# Patient Record
Sex: Female | Born: 1945
Health system: Southern US, Community
[De-identification: ages and names within clinical notes are randomized; demographics above are authoritative.]

## PROBLEM LIST (undated history)

## (undated) DIAGNOSIS — M1990 Unspecified osteoarthritis, unspecified site: Secondary | ICD-10-CM

## (undated) DIAGNOSIS — F32A Depression, unspecified: Secondary | ICD-10-CM

## (undated) DIAGNOSIS — J449 Chronic obstructive pulmonary disease, unspecified: Secondary | ICD-10-CM

## (undated) DIAGNOSIS — R0602 Shortness of breath: Secondary | ICD-10-CM

## (undated) DIAGNOSIS — I639 Cerebral infarction, unspecified: Secondary | ICD-10-CM

## (undated) DIAGNOSIS — I10 Essential (primary) hypertension: Secondary | ICD-10-CM

## (undated) DIAGNOSIS — F419 Anxiety disorder, unspecified: Secondary | ICD-10-CM

## (undated) DIAGNOSIS — F329 Major depressive disorder, single episode, unspecified: Secondary | ICD-10-CM

## (undated) DIAGNOSIS — E039 Hypothyroidism, unspecified: Secondary | ICD-10-CM

## (undated) DIAGNOSIS — R51 Headache: Secondary | ICD-10-CM

## (undated) DIAGNOSIS — J189 Pneumonia, unspecified organism: Secondary | ICD-10-CM

## (undated) DIAGNOSIS — K219 Gastro-esophageal reflux disease without esophagitis: Secondary | ICD-10-CM

## (undated) HISTORY — PX: TOTAL HIP ARTHROPLASTY: SHX124

---

## 2006-03-11 ENCOUNTER — Emergency Department (HOSPITAL_COMMUNITY): Admission: EM | Admit: 2006-03-11 | Discharge: 2006-03-11 | Payer: Self-pay | Admitting: Emergency Medicine

## 2007-03-28 ENCOUNTER — Encounter: Admission: RE | Admit: 2007-03-28 | Discharge: 2007-03-28 | Payer: Self-pay | Admitting: Internal Medicine

## 2008-06-08 ENCOUNTER — Emergency Department (HOSPITAL_COMMUNITY): Admission: EM | Admit: 2008-06-08 | Discharge: 2008-06-08 | Payer: Self-pay | Admitting: Emergency Medicine

## 2008-12-02 ENCOUNTER — Encounter: Admission: RE | Admit: 2008-12-02 | Discharge: 2008-12-02 | Payer: Self-pay | Admitting: Internal Medicine

## 2009-04-08 ENCOUNTER — Encounter: Admission: RE | Admit: 2009-04-08 | Discharge: 2009-04-08 | Payer: Self-pay | Admitting: Internal Medicine

## 2009-05-01 ENCOUNTER — Inpatient Hospital Stay (HOSPITAL_COMMUNITY): Admission: RE | Admit: 2009-05-01 | Discharge: 2009-05-06 | Payer: Self-pay | Admitting: Orthopedic Surgery

## 2009-05-04 ENCOUNTER — Encounter (INDEPENDENT_AMBULATORY_CARE_PROVIDER_SITE_OTHER): Payer: Self-pay | Admitting: Orthopedic Surgery

## 2009-05-25 ENCOUNTER — Emergency Department (HOSPITAL_COMMUNITY): Admission: EM | Admit: 2009-05-25 | Discharge: 2009-05-25 | Payer: Self-pay | Admitting: Emergency Medicine

## 2010-08-06 ENCOUNTER — Encounter: Payer: Self-pay | Admitting: Internal Medicine

## 2010-10-18 LAB — TSH: TSH: 0.054 u[IU]/mL — ABNORMAL LOW (ref 0.350–4.500)

## 2010-10-18 LAB — COMPREHENSIVE METABOLIC PANEL
AST: 31 U/L (ref 0–37)
Albumin: 2.7 g/dL — ABNORMAL LOW (ref 3.5–5.2)
Alkaline Phosphatase: 62 U/L (ref 39–117)
Alkaline Phosphatase: 69 U/L (ref 39–117)
CO2: 24 mEq/L (ref 19–32)
Calcium: 9 mg/dL (ref 8.4–10.5)
Chloride: 98 mEq/L (ref 96–112)
GFR calc Af Amer: >60 mL/min (ref >60)
GFR calc Af Amer: >60 mL/min (ref >60)
GFR calc non Af Amer: >60 mL/min (ref >60)
Glucose, Bld: 94 mg/dL (ref 70–99)
Potassium: 3.9 mEq/L (ref 3.5–5.1)
Potassium: 4 mEq/L (ref 3.5–5.1)
Total Bilirubin: 0.5 mg/dL (ref 0.3–1.2)
Total Bilirubin: 0.7 mg/dL (ref 0.3–1.2)
Total Protein: 7.2 g/dL (ref 6.0–8.3)

## 2010-10-18 LAB — DIFFERENTIAL
Basophils Absolute: 0 10*3/uL (ref 0.0–0.1)
Basophils Relative: 0 % (ref 0–1)
Eosinophils Relative: 3 % (ref 0–5)
Monocytes Absolute: 0.5 10*3/uL (ref 0.1–1.0)
Monocytes Relative: 8 % (ref 3–12)
Neutro Abs: 3.4 10*3/uL (ref 1.7–7.7)
Neutrophils Relative %: 60 % (ref 43–77)

## 2010-10-18 LAB — BASIC METABOLIC PANEL
BUN: 9 mg/dL (ref 6–23)
CO2: 25 mEq/L (ref 19–32)
CO2: 27 mEq/L (ref 19–32)
Calcium: 8.1 mg/dL — ABNORMAL LOW (ref 8.4–10.5)
Calcium: 8.1 mg/dL — ABNORMAL LOW (ref 8.4–10.5)
Chloride: 101 mEq/L (ref 96–112)
Chloride: 98 mEq/L (ref 96–112)
GFR calc Af Amer: >60 mL/min (ref >60)
GFR calc Af Amer: >60 mL/min (ref >60)
GFR calc Af Amer: >60 mL/min (ref >60)
GFR calc non Af Amer: >60 mL/min (ref >60)
Glucose, Bld: 128 mg/dL — ABNORMAL HIGH (ref 70–99)
Potassium: 4.4 mEq/L (ref 3.5–5.1)
Sodium: 133 mEq/L — ABNORMAL LOW (ref 135–145)
Sodium: 134 mEq/L — ABNORMAL LOW (ref 135–145)

## 2010-10-18 LAB — TROPONIN I: Troponin I: 0.03 ng/mL (ref 0.00–0.06)

## 2010-10-18 LAB — CBC
Hemoglobin: 12.7 g/dL (ref 12.0–15.0)
Hemoglobin: 9.5 g/dL — ABNORMAL LOW (ref 12.0–15.0)
Hemoglobin: 9.8 g/dL — ABNORMAL LOW (ref 12.0–15.0)
MCHC: 34.2 g/dL (ref 30.0–36.0)
MCHC: 34.2 g/dL (ref 30.0–36.0)
MCHC: 34.5 g/dL (ref 30.0–36.0)
MCV: 92.1 fL (ref 78.0–100.0)
MCV: 92.7 fL (ref 78.0–100.0)
Platelets: 233 10*3/uL (ref 150–400)
Platelets: 277 10*3/uL (ref 150–400)
RBC: 3.01 MIL/uL — ABNORMAL LOW (ref 3.87–5.11)
RBC: 3.08 MIL/uL — ABNORMAL LOW (ref 3.87–5.11)
RDW: 14.5 % (ref 11.5–15.5)
RDW: 14.6 % (ref 11.5–15.5)
RDW: 15.2 % (ref 11.5–15.5)
WBC: 11.2 10*3/uL — ABNORMAL HIGH (ref 4.0–10.5)
WBC: 5.7 10*3/uL (ref 4.0–10.5)
WBC: 7.8 10*3/uL (ref 4.0–10.5)

## 2010-10-18 LAB — URINALYSIS, ROUTINE W REFLEX MICROSCOPIC
Glucose, UA: NEGATIVE mg/dL
Hgb urine dipstick: NEGATIVE
Ketones, ur: NEGATIVE mg/dL
Specific Gravity, Urine: 1.031 — ABNORMAL HIGH (ref 1.005–1.030)
Urobilinogen, UA: 0.2 mg/dL (ref 0.0–1.0)
pH: 6.5 (ref 5.0–8.0)

## 2010-10-18 LAB — URINE MICROSCOPIC-ADD ON

## 2010-10-18 LAB — PROTIME-INR
INR: 1.12 (ref 0.00–1.49)
INR: 1.25 (ref 0.00–1.49)
INR: 1.44 (ref 0.00–1.49)
INR: 1.58 — ABNORMAL HIGH (ref 0.00–1.49)
Prothrombin Time: 13.8 seconds (ref 11.6–15.2)
Prothrombin Time: 14.3 seconds (ref 11.6–15.2)
Prothrombin Time: 17.4 seconds — ABNORMAL HIGH (ref 11.6–15.2)
Prothrombin Time: 18.7 seconds — ABNORMAL HIGH (ref 11.6–15.2)

## 2010-10-18 LAB — GLUCOSE, CAPILLARY

## 2010-10-18 LAB — CULTURE, BLOOD (ROUTINE X 2)
Culture: NO GROWTH
Culture: NO GROWTH

## 2010-10-18 LAB — PTH, INTACT AND CALCIUM: PTH: 18.4 pg/mL (ref 14.0–72.0)

## 2010-10-18 LAB — URINE CULTURE

## 2010-10-18 LAB — TYPE AND SCREEN: Antibody Screen: NEGATIVE

## 2010-10-18 LAB — CK TOTAL AND CKMB (NOT AT ARMC): CK, MB: 2.1 ng/mL (ref 0.3–4.0)

## 2011-09-07 IMAGING — CR DG CHEST 2V
2 series · 2 of 2 positions shown · non-contrast
Comparison: None

CLINICAL DATA: Trauma, mid chest pain, recent hip replacement

CHEST - 2 VIEW

[w chest pa]
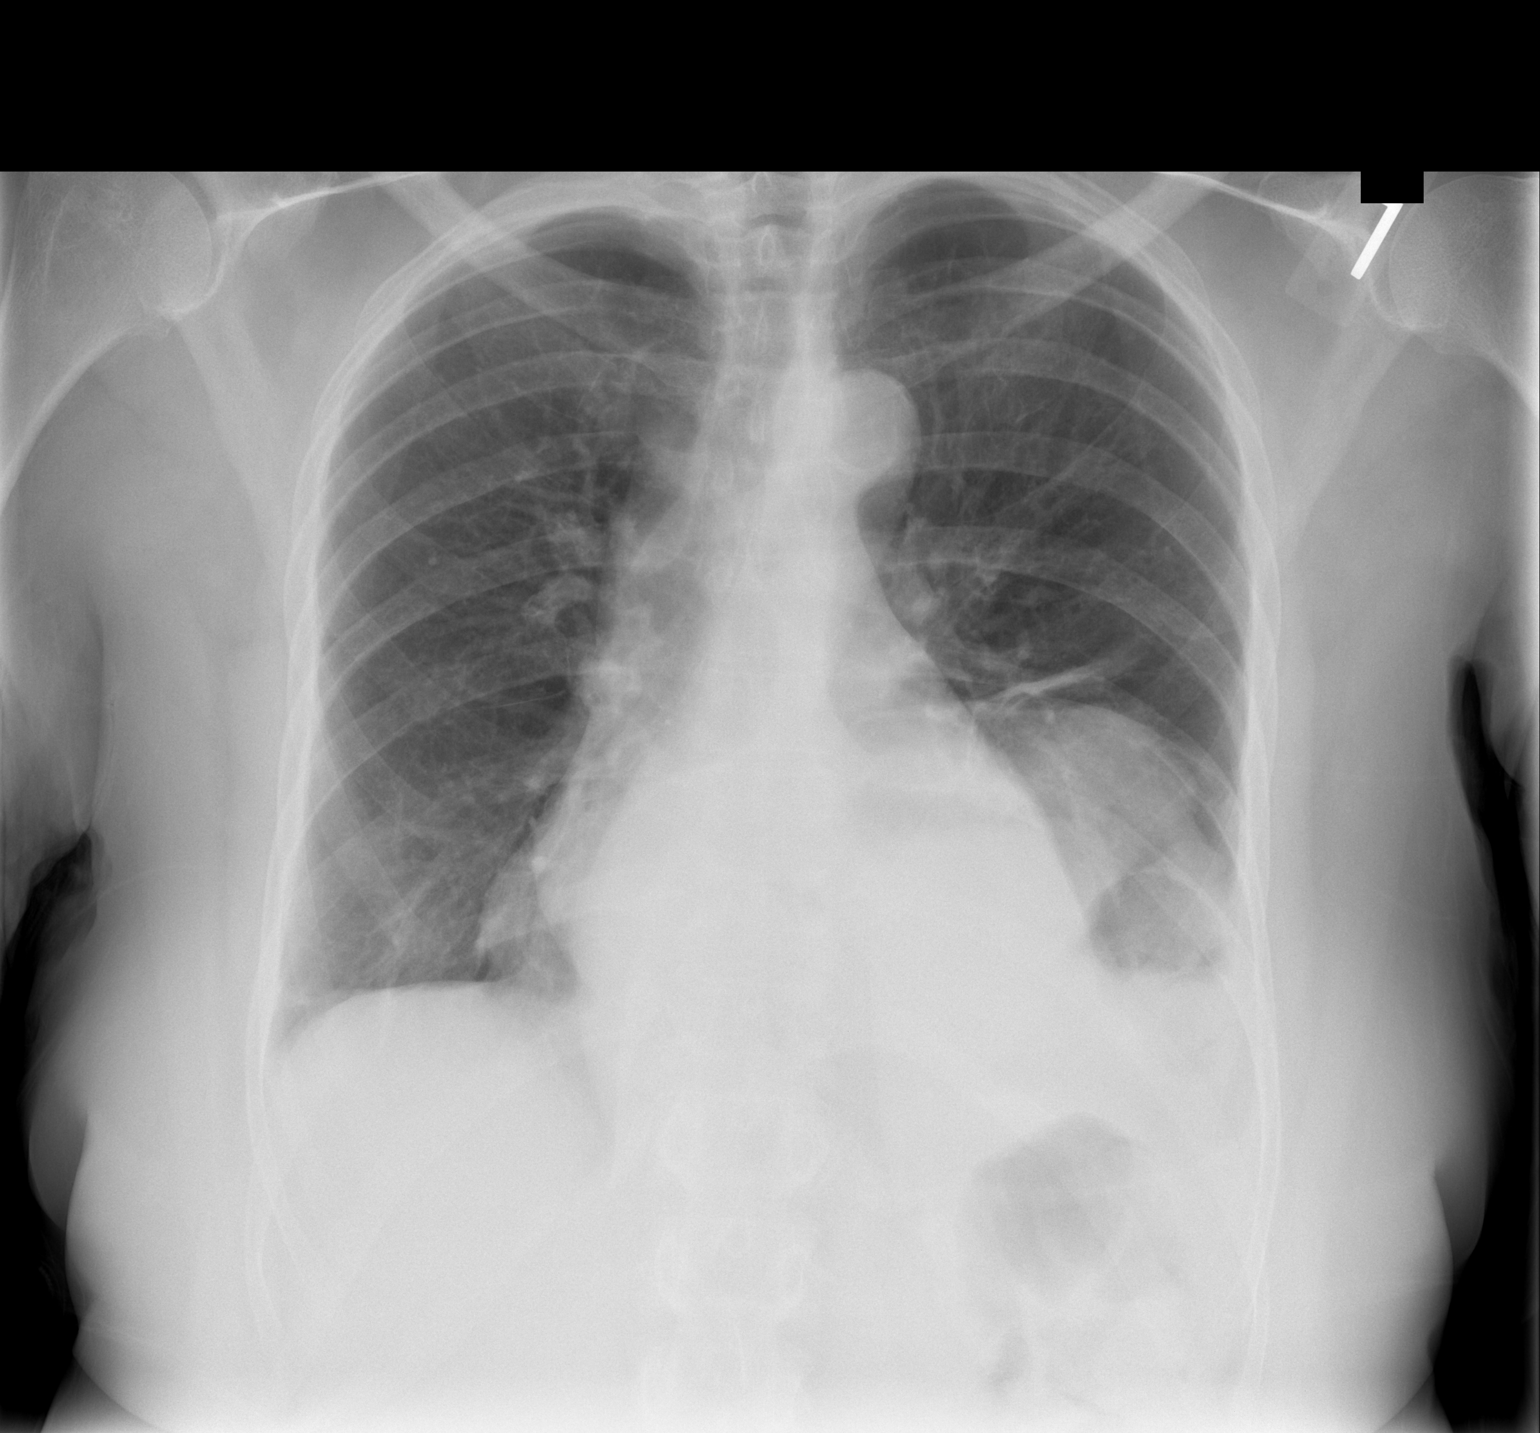

[w chest lat]
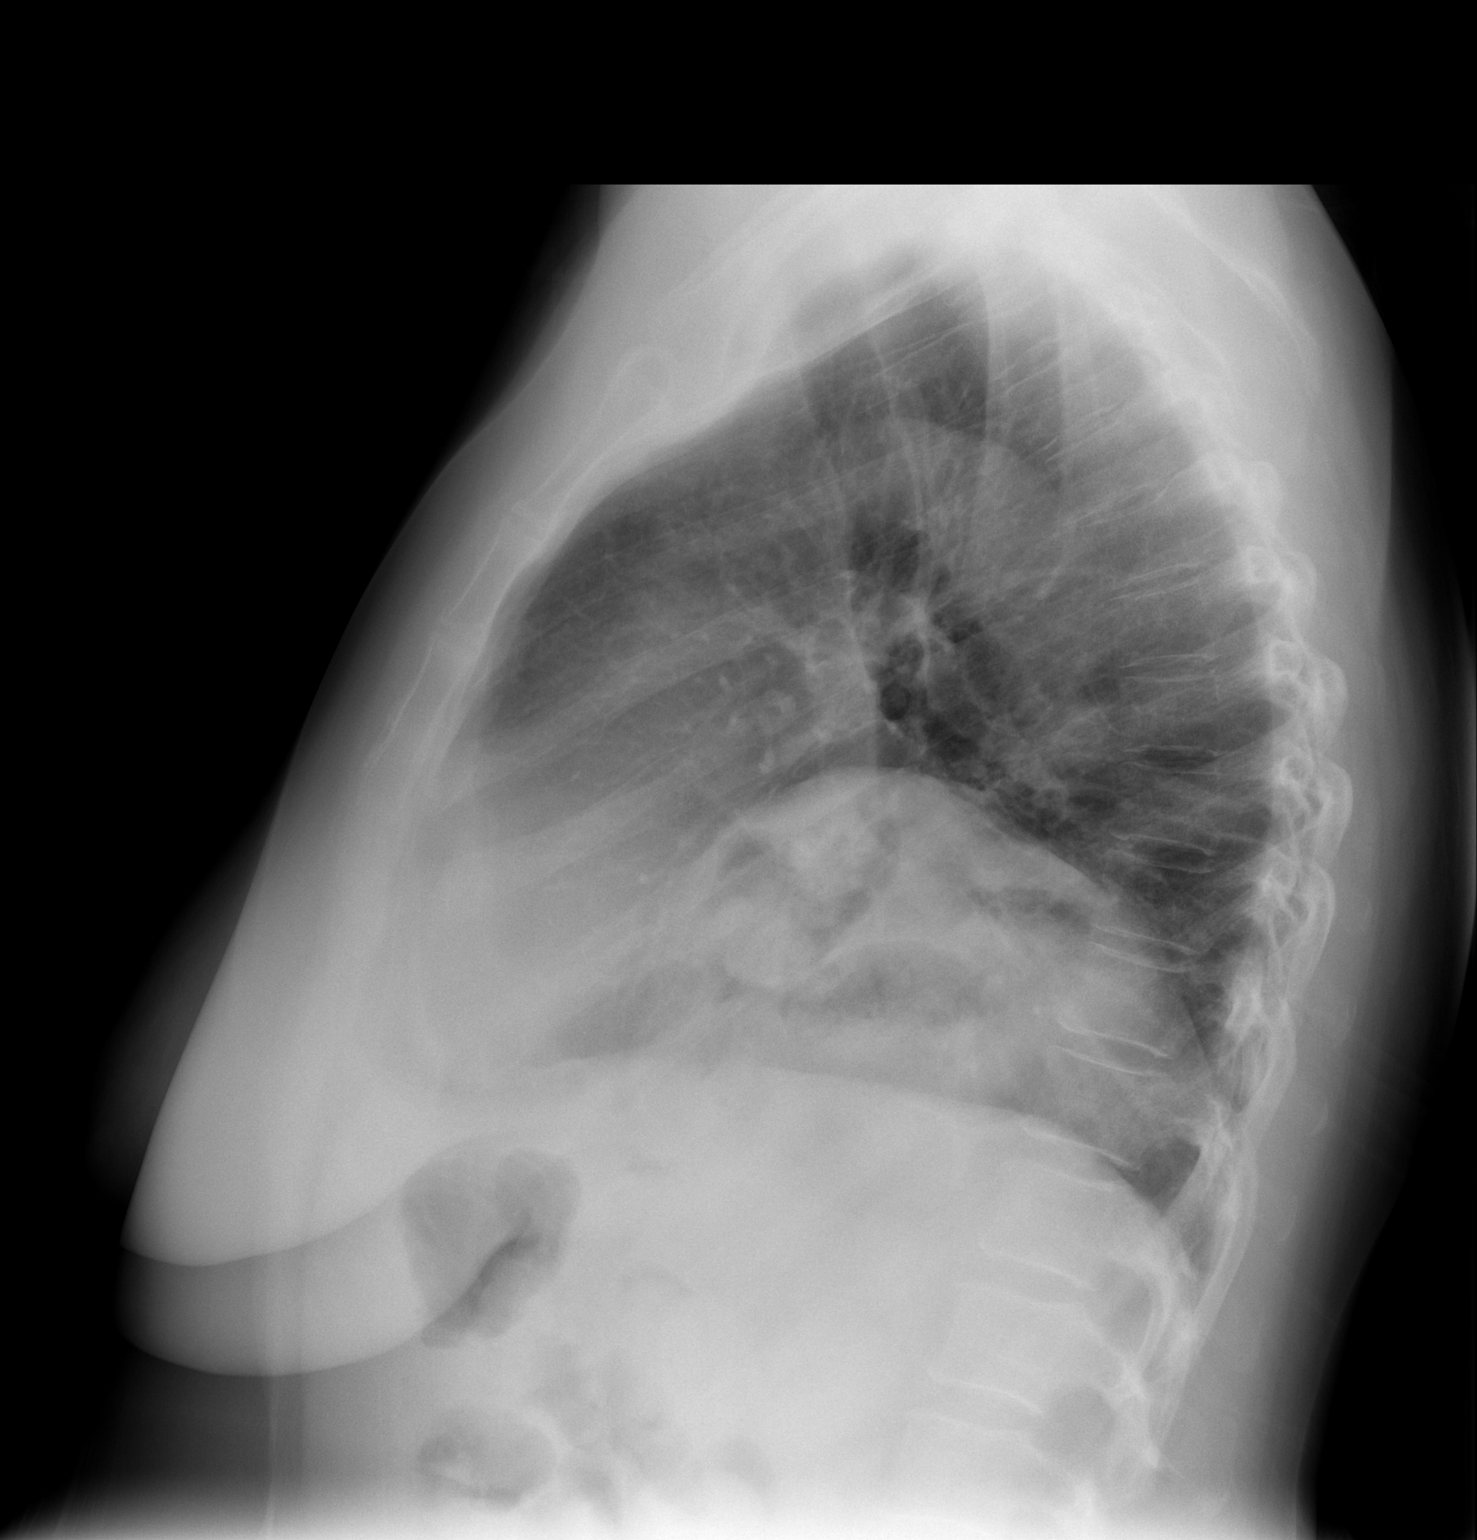

[2 of 2 positions shown; findings below may reference images not displayed]

FINDINGS: Chronic elevation of the left hemidiaphragm with left
lower lobe atelectasis / scarring.  Mild cardiomegaly without
pneumonia, consolidation, edema, effusion or pneumothorax.  Midline
trachea.  Atherosclerosis of the aorta.
IMPRESSION: Elevated left hemidiaphragm.  Left lower lobe atelectasis versus
scarring.
Cardiomegaly without CHF or pneumonia

## 2012-06-01 ENCOUNTER — Emergency Department (HOSPITAL_COMMUNITY)
Admission: EM | Admit: 2012-06-01 | Discharge: 2012-06-01 | Disposition: A | Discharge status: Home / Self Care | Payer: Medicaid Other | Attending: Emergency Medicine | Admitting: Emergency Medicine

## 2012-06-01 ENCOUNTER — Encounter (HOSPITAL_COMMUNITY): Payer: Self-pay | Admitting: *Deleted

## 2012-06-01 ENCOUNTER — Emergency Department (HOSPITAL_COMMUNITY)
Admission: EM | Admit: 2012-06-01 | Discharge: 2012-06-01 | Discharge status: Against Medical Advice | Payer: Medicaid Other | Source: Home / Self Care

## 2012-06-01 DIAGNOSIS — M129 Arthropathy, unspecified: Secondary | ICD-10-CM | POA: Insufficient documentation

## 2012-06-01 DIAGNOSIS — R040 Epistaxis: Secondary | ICD-10-CM

## 2012-06-01 DIAGNOSIS — I10 Essential (primary) hypertension: Secondary | ICD-10-CM | POA: Insufficient documentation

## 2012-06-01 DIAGNOSIS — F17200 Nicotine dependence, unspecified, uncomplicated: Secondary | ICD-10-CM | POA: Insufficient documentation

## 2012-06-01 HISTORY — DX: Unspecified osteoarthritis, unspecified site: M19.90

## 2012-06-01 HISTORY — DX: Essential (primary) hypertension: I10

## 2012-06-01 LAB — CBC WITH DIFFERENTIAL/PLATELET
Basophils Absolute: 0.1 10*3/uL (ref 0.0–0.1)
Basophils Relative: 1 % (ref 0–1)
Eosinophils Absolute: 0.5 10*3/uL (ref 0.0–0.7)
MCH: 34.3 pg — ABNORMAL HIGH (ref 26.0–34.0)
MCHC: 33.8 g/dL (ref 30.0–36.0)
Neutro Abs: 3.1 10*3/uL (ref 1.7–7.7)
Neutrophils Relative %: 55 % (ref 43–77)
RDW: 12.6 % (ref 11.5–15.5)

## 2012-06-01 MED ORDER — PHENYLEPHRINE HCL 0.5 % NA SOLN
1.0000 [drp] | Freq: Once | NASAL | Status: AC
  Administered 2012-06-01: 1 [drp] via NASAL
  Filled 2012-06-01: qty 15

## 2012-06-01 NOTE — ED Notes (Signed)
[facility] staff states they have called the pt x 5 with no answer. called for pt a 6th time in waiting area with no answer

## 2012-06-01 NOTE — ED Notes (Signed)
PT has had cold for 3 days and has been blowing nose and now with intermittent left nose bleeding.  No blood thinners.  BP 106/86.

## 2012-06-01 NOTE — ED Notes (Signed)
PT called X2 no answer 

## 2012-06-01 NOTE — ED Notes (Signed)
OZH:[id number]<HQ> Expected date:06/01/12<BR> Expected time:10:00 AM<BR> Means of arrival:<BR> Comments:<BR> EMS nose bleed

## 2012-06-01 NOTE — ED Provider Notes (Signed)
Medical screening examination/treatment/procedure(s) were performed by non-physician practitioner and as supervising physician I was immediately available for consultation/collaboration.    Nelia Shi, MD 06/01/12 [phone]

## 2012-06-01 NOTE — ED Notes (Signed)
Pt states that she left [facility] today before receiving treatment for nose bleed. Came back. EMS held pressure all the way to hospital with no relief. Has had cold for several days. No blood thinners.

## 2012-06-01 NOTE — ED Provider Notes (Addendum)
History     CSN: [id number]  Arrival date & time 06/01/12  2841   First MD Initiated Contact with Patient 06/01/12 1856      Chief Complaint  Patient presents with  . Epistaxis    (Consider location/radiation/quality/duration/timing/severity/associated sxs/prior treatment) HPI  Pt to the ER with complaints of epistaxis to left nair. She was seen earlier today, the bleeding stop and she left AMA. She came back again now because the bleeding started again. She denies it going down her throat, she denies being on blood thinners. She admits she had nose bleeds when she was younger but that it has been a long time. She has no pain with the bleeding. Upon my arrival to the examination room, she is no longer having a nose bleed. She denies trauma, being hit in the nose, or nose picking. She was blowing her nose when the bleeding initially started earlier today. nad vss  Past Medical History  Diagnosis Date  . Hypertension   . Arthritis     No past surgical history on file.  No family history on file.  History  Substance Use Topics  . Smoking status: Current Every Day Smoker  . Smokeless tobacco: Not on file  . Alcohol Use: No    OB History    Grav Para Term Preterm Abortions TAB SAB Ect Mult Living                  Review of Systems  Review of Systems  Gen: no weight loss, fevers, chills, night sweats  Eyes: no discharge or drainage, no occular pain or visual changes  Nose: + epistaxis no rhinorrhea  Mouth: no dental pain, no sore throat  Neck: no neck pain  Lungs:No wheezing, coughing or hemoptysis MSK:  No abnormalities  Neuro: no headache, no focal neurologic deficits  Skin: no abnormalities Psyche: negative.   Allergies  Penicillins  Home Medications   Current Outpatient Rx  Name  Route  Sig  Dispense  Refill  . SYNTHROID PO   Oral   Take 1 tablet by mouth daily.         Marland Kitchen PRESCRIPTION MEDICATION   Oral   Take 1 tablet by mouth as needed. UNKNOWN  ARTHRITIS/PAIN MEDICATION         . PRESCRIPTION MEDICATION   Oral   Take 1 tablet by mouth daily. UNKNOWN BLOOD PRESSURE MEDICATION           BP 153/99  Pulse 91  Temp 98.5 F (36.9 C) (Oral)  Resp 18  SpO2 97%  Physical Exam  Nursing note and vitals reviewed. Constitutional: She appears well-developed and well-nourished. No distress.  HENT:  Head: Normocephalic and atraumatic.  Nose: No mucosal edema, rhinorrhea, nose lacerations, sinus tenderness, nasal deformity, septal deviation or nasal septal hematoma. Epistaxis is observed.  No foreign bodies.  Eyes: Pupils are equal, round, and reactive to light.  Neck: Normal range of motion. Neck supple.  Cardiovascular: Normal rate and regular rhythm.   Pulmonary/Chest: Effort normal.  Abdominal: Soft.  Neurological: She is alert.  Skin: Skin is warm and dry.    ED Course  Procedures (including critical care time)   Labs Reviewed  CBC WITH DIFFERENTIAL   No results found.   1. Epistaxis       MDM  Cbc drawn to check hemoglobin, hematocrit and platelets. Pt discuss with Dr. Radford Pax   If H and H is normal, pt can be discharged with ENT follow-up information. Pt  can buy neo-synephrine and normal saline spray OTC.         Dorthula Matas, PA 06/01/12 1942  Dorthula Matas, PA 06/01/12 2003

## 2012-06-01 NOTE — ED Provider Notes (Signed)
Medical screening examination/treatment/procedure(s) were performed by non-physician practitioner and as supervising physician I was immediately available for consultation/collaboration.    Nelia Shi, MD 06/01/12 2012

## 2012-07-02 ENCOUNTER — Inpatient Hospital Stay (HOSPITAL_COMMUNITY): Payer: Medicaid Other

## 2012-07-02 ENCOUNTER — Emergency Department (HOSPITAL_COMMUNITY): Payer: Medicaid Other

## 2012-07-02 ENCOUNTER — Inpatient Hospital Stay (HOSPITAL_COMMUNITY)
Admission: EM | Admit: 2012-07-02 | Discharge: 2012-07-05 | DRG: 189 | Disposition: A | Discharge status: Home / Self Care | Payer: Medicaid Other | Attending: Internal Medicine | Admitting: Internal Medicine

## 2012-07-02 ENCOUNTER — Encounter (HOSPITAL_COMMUNITY): Payer: Self-pay

## 2012-07-02 DIAGNOSIS — M1990 Unspecified osteoarthritis, unspecified site: Secondary | ICD-10-CM | POA: Diagnosis present

## 2012-07-02 DIAGNOSIS — E039 Hypothyroidism, unspecified: Secondary | ICD-10-CM | POA: Diagnosis present

## 2012-07-02 DIAGNOSIS — J154 Pneumonia due to other streptococci: Secondary | ICD-10-CM | POA: Diagnosis present

## 2012-07-02 DIAGNOSIS — J441 Chronic obstructive pulmonary disease with (acute) exacerbation: Secondary | ICD-10-CM | POA: Diagnosis present

## 2012-07-02 DIAGNOSIS — I10 Essential (primary) hypertension: Secondary | ICD-10-CM | POA: Diagnosis present

## 2012-07-02 DIAGNOSIS — Z23 Encounter for immunization: Secondary | ICD-10-CM

## 2012-07-02 DIAGNOSIS — F17200 Nicotine dependence, unspecified, uncomplicated: Secondary | ICD-10-CM | POA: Diagnosis present

## 2012-07-02 DIAGNOSIS — J189 Pneumonia, unspecified organism: Secondary | ICD-10-CM

## 2012-07-02 DIAGNOSIS — Z79899 Other long term (current) drug therapy: Secondary | ICD-10-CM

## 2012-07-02 DIAGNOSIS — J9600 Acute respiratory failure, unspecified whether with hypoxia or hypercapnia: Principal | ICD-10-CM | POA: Diagnosis present

## 2012-07-02 DIAGNOSIS — D72829 Elevated white blood cell count, unspecified: Secondary | ICD-10-CM | POA: Diagnosis present

## 2012-07-02 DIAGNOSIS — M129 Arthropathy, unspecified: Secondary | ICD-10-CM | POA: Diagnosis present

## 2012-07-02 DIAGNOSIS — A4902 Methicillin resistant Staphylococcus aureus infection, unspecified site: Secondary | ICD-10-CM | POA: Diagnosis present

## 2012-07-02 DIAGNOSIS — J9801 Acute bronchospasm: Secondary | ICD-10-CM

## 2012-07-02 HISTORY — DX: Essential (primary) hypertension: I10

## 2012-07-02 HISTORY — DX: Shortness of breath: R06.02

## 2012-07-02 HISTORY — DX: Hypothyroidism, unspecified: E03.9

## 2012-07-02 HISTORY — DX: Pneumonia, unspecified organism: J18.9

## 2012-07-02 HISTORY — DX: Major depressive disorder, single episode, unspecified: F32.9

## 2012-07-02 HISTORY — DX: Anxiety disorder, unspecified: F41.9

## 2012-07-02 HISTORY — DX: Depression, unspecified: F32.A

## 2012-07-02 HISTORY — DX: Chronic obstructive pulmonary disease, unspecified: J44.9

## 2012-07-02 HISTORY — DX: Headache: R51

## 2012-07-02 HISTORY — DX: Cerebral infarction, unspecified: I63.9

## 2012-07-02 LAB — CBC
Hemoglobin: 12.9 g/dL (ref 12.0–15.0)
MCH: 33.6 pg (ref 26.0–34.0)
MCHC: 33.8 g/dL (ref 30.0–36.0)
Platelets: 256 10*3/uL (ref 150–400)

## 2012-07-02 LAB — CBC WITH DIFFERENTIAL/PLATELET
Basophils Absolute: 0 K/uL (ref 0.0–0.1)
Basophils Relative: 0 % (ref 0–1)
Eosinophils Absolute: 0 K/uL (ref 0.0–0.7)
Eosinophils Relative: 0 % (ref 0–5)
HCT: 39.4 % (ref 36.0–46.0)
Hemoglobin: 13.1 g/dL (ref 12.0–15.0)
Lymphocytes Relative: 5 % — ABNORMAL LOW (ref 12–46)
Lymphs Abs: 0.6 K/uL — ABNORMAL LOW (ref 0.7–4.0)
MCH: 33.4 pg (ref 26.0–34.0)
MCHC: 33.2 g/dL (ref 30.0–36.0)
MCV: 100.5 fL — ABNORMAL HIGH (ref 78.0–100.0)
Monocytes Absolute: 0.1 K/uL (ref 0.1–1.0)
Monocytes Relative: 1 % — ABNORMAL LOW (ref 3–12)
Neutro Abs: 10.6 K/uL — ABNORMAL HIGH (ref 1.7–7.7)
Neutrophils Relative %: 94 % — ABNORMAL HIGH (ref 43–77)
Platelets: 252 K/uL (ref 150–400)
RBC: 3.92 MIL/uL (ref 3.87–5.11)
RDW: 12.6 % (ref 11.5–15.5)
WBC: 11.3 K/uL — ABNORMAL HIGH (ref 4.0–10.5)

## 2012-07-02 LAB — COMPREHENSIVE METABOLIC PANEL WITH GFR
ALT: 11 U/L (ref 0–35)
AST: 27 U/L (ref 0–37)
Albumin: 3.1 g/dL — ABNORMAL LOW (ref 3.5–5.2)
Alkaline Phosphatase: 105 U/L (ref 39–117)
BUN: 25 mg/dL — ABNORMAL HIGH (ref 6–23)
CO2: 21 meq/L (ref 19–32)
Calcium: 8.6 mg/dL (ref 8.4–10.5)
Chloride: 100 meq/L (ref 96–112)
Creatinine, Ser: 1.13 mg/dL — ABNORMAL HIGH (ref 0.50–1.10)
GFR calc Af Amer: 58 mL/min — ABNORMAL LOW
GFR calc non Af Amer: 50 mL/min — ABNORMAL LOW
Glucose, Bld: 92 mg/dL (ref 70–99)
Potassium: 3.7 meq/L (ref 3.5–5.1)
Sodium: 138 meq/L (ref 135–145)
Total Bilirubin: 0.7 mg/dL (ref 0.3–1.2)
Total Protein: 7.1 g/dL (ref 6.0–8.3)

## 2012-07-02 LAB — PRO B NATRIURETIC PEPTIDE: Pro B Natriuretic peptide (BNP): 3936 pg/mL — ABNORMAL HIGH (ref 0–125)

## 2012-07-02 LAB — POCT I-STAT 3, ART BLOOD GAS (G3+)
Acid-base deficit: 7 mmol/L — ABNORMAL HIGH (ref 0.0–2.0)
Bicarbonate: 16.9 meq/L — ABNORMAL LOW (ref 20.0–24.0)
O2 Saturation: 91 %
Patient temperature: 98.7
TCO2: 18 mmol/L (ref 0–100)
pCO2 arterial: 27.5 mmHg — ABNORMAL LOW (ref 35.0–45.0)
pH, Arterial: 7.396 (ref 7.350–7.450)
pO2, Arterial: 59 mmHg — ABNORMAL LOW (ref 80.0–100.0)

## 2012-07-02 LAB — CREATININE, SERUM
Creatinine, Ser: 1.01 mg/dL (ref 0.50–1.10)
GFR calc non Af Amer: 58 mL/min — ABNORMAL LOW (ref >90)

## 2012-07-02 LAB — INFLUENZA PANEL BY PCR (TYPE A & B): H1N1 flu by pcr: NOT DETECTED

## 2012-07-02 MED ORDER — ALBUTEROL SULFATE (5 MG/ML) 0.5% IN NEBU
2.5000 mg | INHALATION_SOLUTION | Freq: Once | RESPIRATORY_TRACT | Status: AC
  Administered 2012-07-02: 2.5 mg via RESPIRATORY_TRACT
  Filled 2012-07-02: qty 1

## 2012-07-02 MED ORDER — MUPIROCIN 2 % EX OINT
1.0000 "application " | TOPICAL_OINTMENT | Freq: Two times a day (BID) | CUTANEOUS | Status: DC
  Administered 2012-07-02 – 2012-07-05 (×6): 1 via NASAL
  Filled 2012-07-02: qty 22

## 2012-07-02 MED ORDER — LEVOFLOXACIN IN D5W 750 MG/150ML IV SOLN
750.0000 mg | INTRAVENOUS | Status: DC
  Administered 2012-07-02 – 2012-07-04 (×3): 750 mg via INTRAVENOUS
  Filled 2012-07-02 (×4): qty 150

## 2012-07-02 MED ORDER — QUETIAPINE FUMARATE 100 MG PO TABS
100.0000 mg | ORAL_TABLET | Freq: Every day | ORAL | Status: DC
  Administered 2012-07-02 – 2012-07-04 (×3): 100 mg via ORAL
  Filled 2012-07-02 (×4): qty 1

## 2012-07-02 MED ORDER — SODIUM CHLORIDE 0.9 % IV BOLUS (SEPSIS)
500.0000 mL | Freq: Once | INTRAVENOUS | Status: AC
  Administered 2012-07-02: 10:00:00 via INTRAVENOUS

## 2012-07-02 MED ORDER — METHYLPREDNISOLONE SODIUM SUCC 125 MG IJ SOLR
125.0000 mg | Freq: Three times a day (TID) | INTRAMUSCULAR | Status: DC
  Administered 2012-07-02 – 2012-07-03 (×2): 125 mg via INTRAVENOUS
  Filled 2012-07-02 (×5): qty 2

## 2012-07-02 MED ORDER — METHYLPREDNISOLONE SODIUM SUCC 125 MG IJ SOLR
125.0000 mg | Freq: Once | INTRAMUSCULAR | Status: AC
  Administered 2012-07-02: 125 mg via INTRAVENOUS
  Filled 2012-07-02: qty 2

## 2012-07-02 MED ORDER — LEVOFLOXACIN IN D5W 500 MG/100ML IV SOLN
500.0000 mg | Freq: Once | INTRAVENOUS | Status: AC
  Administered 2012-07-02: 500 mg via INTRAVENOUS
  Filled 2012-07-02: qty 100

## 2012-07-02 MED ORDER — SODIUM CHLORIDE 0.9 % IV SOLN
INTRAVENOUS | Status: AC
  Administered 2012-07-02 – 2012-07-03 (×2): 1000 mL via INTRAVENOUS

## 2012-07-02 MED ORDER — NAPROXEN 500 MG PO TABS
500.0000 mg | ORAL_TABLET | Freq: Two times a day (BID) | ORAL | Status: DC
  Administered 2012-07-03 – 2012-07-04 (×4): 500 mg via ORAL
  Filled 2012-07-02 (×5): qty 1

## 2012-07-02 MED ORDER — PNEUMOCOCCAL VAC POLYVALENT 25 MCG/0.5ML IJ INJ
0.5000 mL | INJECTION | INTRAMUSCULAR | Status: AC
  Administered 2012-07-03: 0.5 mL via INTRAMUSCULAR
  Filled 2012-07-02: qty 0.5

## 2012-07-02 MED ORDER — INFLUENZA VIRUS VACC SPLIT PF IM SUSP
0.5000 mL | INTRAMUSCULAR | Status: AC
  Administered 2012-07-03: 0.5 mL via INTRAMUSCULAR
  Filled 2012-07-02: qty 0.5

## 2012-07-02 MED ORDER — ENOXAPARIN SODIUM 40 MG/0.4ML ~~LOC~~ SOLN
40.0000 mg | SUBCUTANEOUS | Status: DC
  Administered 2012-07-02 – 2012-07-04 (×3): 40 mg via SUBCUTANEOUS
  Filled 2012-07-02 (×4): qty 0.4

## 2012-07-02 MED ORDER — ALBUTEROL SULFATE (5 MG/ML) 0.5% IN NEBU
5.0000 mg | INHALATION_SOLUTION | Freq: Once | RESPIRATORY_TRACT | Status: AC
  Administered 2012-07-02: 5 mg via RESPIRATORY_TRACT
  Filled 2012-07-02: qty 1

## 2012-07-02 MED ORDER — IPRATROPIUM BROMIDE 0.02 % IN SOLN: 0.5000 mg | RESPIRATORY_TRACT | Status: DC | PRN

## 2012-07-02 MED ORDER — CHLORHEXIDINE GLUCONATE CLOTH 2 % EX PADS
6.0000 | MEDICATED_PAD | Freq: Every day | CUTANEOUS | Status: DC
  Administered 2012-07-03 – 2012-07-05 (×3): 6 via TOPICAL

## 2012-07-02 MED ORDER — IPRATROPIUM BROMIDE 0.02 % IN SOLN
0.5000 mg | Freq: Four times a day (QID) | RESPIRATORY_TRACT | Status: DC
  Administered 2012-07-02 – 2012-07-05 (×11): 0.5 mg via RESPIRATORY_TRACT
  Filled 2012-07-02 (×11): qty 2.5

## 2012-07-02 MED ORDER — LEVOFLOXACIN IN D5W 250 MG/50ML IV SOLN
250.0000 mg | INTRAVENOUS | Status: AC
  Administered 2012-07-02: 250 mg via INTRAVENOUS
  Filled 2012-07-02: qty 50

## 2012-07-02 MED ORDER — GUAIFENESIN ER 600 MG PO TB12
1200.0000 mg | ORAL_TABLET | Freq: Two times a day (BID) | ORAL | Status: DC
  Administered 2012-07-02 – 2012-07-03 (×3): 1200 mg via ORAL
  Filled 2012-07-02 (×5): qty 2

## 2012-07-02 MED ORDER — LEVOTHYROXINE SODIUM 100 MCG PO TABS
100.0000 ug | ORAL_TABLET | Freq: Every day | ORAL | Status: DC
  Administered 2012-07-03 – 2012-07-04 (×2): 100 ug via ORAL
  Filled 2012-07-02 (×3): qty 1

## 2012-07-02 MED ORDER — SODIUM CHLORIDE 0.9 % IV BOLUS (SEPSIS)
500.0000 mL | Freq: Once | INTRAVENOUS | Status: AC
  Administered 2012-07-02: 500 mL via INTRAVENOUS

## 2012-07-02 MED ORDER — OXYCODONE-ACETAMINOPHEN 5-325 MG PO TABS: 1.0000 | ORAL_TABLET | Freq: Four times a day (QID) | ORAL | Status: DC | PRN

## 2012-07-02 MED ORDER — ALBUTEROL SULFATE (5 MG/ML) 0.5% IN NEBU
2.5000 mg | INHALATION_SOLUTION | Freq: Four times a day (QID) | RESPIRATORY_TRACT | Status: DC
  Administered 2012-07-02 – 2012-07-05 (×11): 2.5 mg via RESPIRATORY_TRACT
  Filled 2012-07-02 (×11): qty 0.5

## 2012-07-02 MED ORDER — IPRATROPIUM BROMIDE 0.02 % IN SOLN
0.5000 mg | Freq: Once | RESPIRATORY_TRACT | Status: AC
  Administered 2012-07-02: 0.5 mg via RESPIRATORY_TRACT
  Filled 2012-07-02: qty 2.5

## 2012-07-02 MED ORDER — ALBUTEROL SULFATE (5 MG/ML) 0.5% IN NEBU: 2.5000 mg | INHALATION_SOLUTION | RESPIRATORY_TRACT | Status: DC | PRN

## 2012-07-02 NOTE — ED Notes (Signed)
Hospitalist notified patient was unable to complete CT of CHEST.

## 2012-07-02 NOTE — ED Notes (Addendum)
CT called asking if pt is able to lay flat for CT at this moment; pt states that she does not believe she can lay flat long enough for scan; CT states scan will be rescheduled for tomorrow per pt request

## 2012-07-02 NOTE — ED Notes (Signed)
From home, pt c/o shortness of breath from congestion with yellow sputum, no chest pain,

## 2012-07-02 NOTE — ED Notes (Signed)
Daughters contact information, Ayesha Rumpf [phone]; Granddaughter's contact information, Kia Pair [phone]

## 2012-07-02 NOTE — ED Notes (Signed)
Spoke with Dr Benjamine Sprague, patient is unable to tolerate lying flat for CHEST CT

## 2012-07-02 NOTE — Consult Note (Signed)
Patient: [facility] DOB: 03/05/1946 Date of Admission: 07/02/2012            [facility]   Date of Consult: 07/02/2012 MD requesting consult:  Janee Morn ([facility])  Reason for consult:  resp distress   HPI -  66yo female active smoker with hx HTN, presented 12/19 with 1 week hx SOB, cough productive yellow sputum.  CXR in ER revealed R sided PNA and mild renal insufficiency.  [facility] was called to admit.  Pt later developed increased resp distress, ?needing bipap, wheezing and PCCM asked to consult.   Allergies:  Allergies  Allergen Reactions  . Penicillins Swelling     PMH: Past Medical History  Diagnosis Date  . Hypertension   . Arthritis   . HTN (hypertension) 07/02/2012  . Hypothyroidism 07/02/2012    Home meds:  (Not in a hospital admission)   Social Hx: History   Social History  . Marital Status: Married    Spouse Name: N/A    Number of Children: N/A  . Years of Education: N/A   Occupational History  . Not on file.   Social History Main Topics  . Smoking status: Current Every Day Smoker  . Smokeless tobacco: Not on file  . Alcohol Use: No  . Drug Use: No  . Sexually Active:    Other Topics Concern  . Not on file   Social History Narrative  . No narrative on file     Family Hx: History reviewed. No pertinent family history.   ROS: Review of Systems - c/p SOB, cough productive yellow sputum, wheezing.  10 lbs weight loss over 2 months, night sweats.  Has had recent sick contacts with flu.  Denies chest pain, hemoptysis, orthopnea.  All other systems reviewed and were neg.      Filed Vitals:   07/02/12 1008 07/02/12 1015 07/02/12 1045 07/02/12 1100  BP: 98/45  103/69 110/74  Pulse: 99 101 102 101  Temp: 97.4 F (36.3 C)     TempSrc: Oral     Resp: 16 28 31  32  SpO2: 95% 96% 94% 95%    chest X-ray Dg Chest Port 1 View  07/02/2012  *RADIOLOGY REPORT*  Clinical Data: Shortness of breath, cough and wheezing.  PORTABLE CHEST - 1 VIEW   Comparison: 05/25/2009.  Findings: The heart is mildly enlarged but stable.  There is marked elevation of the left hemidiaphragm with overlying vascular crowding and atelectasis.  Right lower lobe process is likely a combination of infiltrate, edema and atelectasis.  The bony thorax is intact.  IMPRESSION:  1.  Right lower lobe process likely a combination of atelectasis, infiltrate and effusion. 2.  Marked elevation of the left hemidiaphragm.   Original Report Authenticated By: Rudie Meyer, M.D.      CBC    Component Value Date/Time   WBC 11.3* 07/02/2012 1028   RBC 3.92 07/02/2012 1028   HGB 13.1 07/02/2012 1028   HCT 39.4 07/02/2012 1028   PLT 252 07/02/2012 1028   MCV 100.5* 07/02/2012 1028   MCH 33.4 07/02/2012 1028   MCHC 33.2 07/02/2012 1028   RDW 12.6 07/02/2012 1028   LYMPHSABS 0.6* 07/02/2012 1028   MONOABS 0.1 07/02/2012 1028   EOSABS 0.0 07/02/2012 1028   BASOSABS 0.0 07/02/2012 1028     BMET    Component Value Date/Time   NA 138 07/02/2012 1028   K 3.7 07/02/2012 1028   CL 100 07/02/2012 1028   CO2 21 07/02/2012 1028  GLUCOSE 92 07/02/2012 1028   BUN 25* 07/02/2012 1028   CREATININE 1.13* 07/02/2012 1028   CALCIUM 8.6 07/02/2012 1028   CALCIUM 8.0* 05/03/2009 1935   GFRNONAA 50* 07/02/2012 1028   GFRAA 58* 07/02/2012 1028     ABG    Component Value Date/Time   PHART 7.396 07/02/2012 1253   PCO2ART 27.5* 07/02/2012 1253   PO2ART 59.0* 07/02/2012 1253   HCO3 16.9* 07/02/2012 1253   TCO2 18 07/02/2012 1253   ACIDBASEDEF 7.0* 07/02/2012 1253   O2SAT 91.0 07/02/2012 1253      EXAM: General: chronically ill appearing female, acutely anxious  Neuro: awake, alert, MAE, no LA  CV:  s1s2 rrr PULM: resps even non labored, cta GI: abd soft, +bs Extremities:  Warm and dry, no edema    IMPRESSION/ PLAN:  Acute respiratory failure r/t CAP +/- AECOPD.   PLAN -  Abx for CAP  Steroids, BD for ??AECOPD  O2 as needed  pulm hygiene  CT chest now - r/o  perihilar mass  F/u CXR  Hold on bipap for now Admit SDU per [facility]  Will need outpt pulm f/u ?COPD     WHITEHEART,KATHRYN, NP 07/02/2012  1:16 PM Pager: [phone]  *Care during the described time interval was provided by me and/or other providers on the critical care team. I have reviewed this patient's available data, including medical history, events of note, physical examination and test results as part of my evaluation.  Admit to SDU with [facility], agree with plan above, will f/u in AM.  Patient seen and examined, agree with above note.  I dictated the care and orders written for this patient under my direction.  Koren Bound, M.D. [phone]

## 2012-07-02 NOTE — ED Provider Notes (Signed)
History     CSN: [id number]  Arrival date & time 07/02/12  4098   First MD Initiated Contact with Patient 07/02/12 1001      Chief Complaint  Patient presents with  . Respiratory Distress  . Cough    (Consider location/radiation/quality/duration/timing/severity/associated sxs/prior treatment) HPI Pt with 1 day of SOB, wheezing and productive cough of yellow sputum. No fever, chills, chest pain, lower ext swelling or pain. No history of COPD/ asthma. Pt PMD is Dr August Luz Past Medical History  Diagnosis Date  . Hypertension   . Arthritis   . HTN (hypertension) 07/02/2012  . Hypothyroidism 07/02/2012    History reviewed. No pertinent past surgical history.  History reviewed. No pertinent family history.  History  Substance Use Topics  . Smoking status: Current Every Day Smoker  . Smokeless tobacco: Not on file  . Alcohol Use: No    OB History    Grav Para Term Preterm Abortions TAB SAB Ect Mult Living                  Review of Systems  Constitutional: Positive for fatigue. Negative for fever and chills.  HENT: Negative for sore throat, trouble swallowing, neck pain and neck stiffness.   Respiratory: Positive for cough, shortness of breath and wheezing. Negative for chest tightness.   Cardiovascular: Negative for chest pain, palpitations and leg swelling.  Gastrointestinal: Negative for nausea, vomiting and abdominal pain.  Genitourinary: Negative for dysuria.  Musculoskeletal: Negative for myalgias and back pain.  Neurological: Negative for weakness, numbness and headaches.  All other systems reviewed and are negative.    Allergies  Penicillins  Home Medications   Current Outpatient Rx  Name  Route  Sig  Dispense  Refill  . AMLODIPINE BESYLATE 10 MG PO TABS   Oral   Take 10 mg by mouth daily.         Marland Kitchen LEVOTHYROXINE SODIUM 100 MCG PO TABS   Oral   Take 100 mcg by mouth daily.         Marland Kitchen NAPROXEN 500 MG PO TABS   Oral   Take 500 mg by mouth 2  (two) times daily with a meal.         . OXYCODONE-ACETAMINOPHEN 7.5-325 MG PO TABS   Oral   Take 1 tablet by mouth 4 (four) times daily.         Marland Kitchen PREDNISONE 20 MG PO TABS   Oral   Take 20 mg by mouth daily.         . QUETIAPINE FUMARATE 100 MG PO TABS   Oral   Take 100 mg by mouth at bedtime.           BP 84/30  Pulse 102  Temp 97.4 F (36.3 C) (Oral)  Resp 30  SpO2 95%  Physical Exam  Nursing note and vitals reviewed. Constitutional: She is oriented to person, place, and time. She appears well-developed and well-nourished. No distress.  HENT:  Head: Normocephalic and atraumatic.  Mouth/Throat: Oropharynx is clear and moist. No oropharyngeal exudate.  Eyes: EOM are normal. Pupils are equal, round, and reactive to light.  Neck: Normal range of motion. Neck supple.  Cardiovascular: Normal rate and regular rhythm.   Pulmonary/Chest: Effort normal. No respiratory distress. She has wheezes. She has rales.       Diffuse exp wheezing and scattered rales. Speaking in ful sentences  Abdominal: Soft. Bowel sounds are normal. She exhibits no distension and no mass. There is  no tenderness. There is no rebound and no guarding.  Musculoskeletal: Normal range of motion. She exhibits no edema and no tenderness.       No lower ext swelling or pain  Neurological: She is alert and oriented to person, place, and time.  Skin: Skin is warm and dry. No rash noted. No erythema.  Psychiatric: She has a normal mood and affect. Her behavior is normal.    ED Course  Procedures (including critical care time)  Labs Reviewed  CBC WITH DIFFERENTIAL - Abnormal; Notable for the following:    WBC 11.3 (*)     MCV 100.5 (*)     Neutrophils Relative 94 (*)     Lymphocytes Relative 5 (*)     Monocytes Relative 1 (*)     Neutro Abs 10.6 (*)     Lymphs Abs 0.6 (*)     All other components within normal limits  COMPREHENSIVE METABOLIC PANEL - Abnormal; Notable for the following:    BUN 25  (*)     Creatinine, Ser 1.13 (*)     Albumin 3.1 (*)     GFR calc non Af Amer 50 (*)     GFR calc Af Amer 58 (*)     All other components within normal limits  POCT I-STAT 3, BLOOD GAS (G3+) - Abnormal; Notable for the following:    pCO2 arterial 27.5 (*)     pO2, Arterial 59.0 (*)     Bicarbonate 16.9 (*)     Acid-base deficit 7.0 (*)     All other components within normal limits  TROPONIN I  INFLUENZA PANEL BY PCR   Dg Chest Port 1 View  07/02/2012  *RADIOLOGY REPORT*  Clinical Data: Shortness of breath, cough and wheezing.  PORTABLE CHEST - 1 VIEW  Comparison: 05/25/2009.  Findings: The heart is mildly enlarged but stable.  There is marked elevation of the left hemidiaphragm with overlying vascular crowding and atelectasis.  Right lower lobe process is likely a combination of infiltrate, edema and atelectasis.  The bony thorax is intact.  IMPRESSION:  1.  Right lower lobe process likely a combination of atelectasis, infiltrate and effusion. 2.  Marked elevation of the left hemidiaphragm.   Original Report Authenticated By: Rudie Meyer, M.D.      1. Community acquired pneumonia   2. Bronchospasm      Date: 07/02/2012  Rate: 99  Rhythm: normal sinus rhythm  QRS Axis: normal  Intervals: normal  ST/T Wave abnormalities: normal  Conduction Disutrbances:none  Narrative Interpretation:   Old EKG Reviewed: unchanged  CRITICAL CARE Performed by: Ranae Palms, Dalary Hollar   Total critical care time: 20  Critical care time was exclusive of separately billable procedures and treating other patients.  Critical care was necessary to treat or prevent imminent or life-threatening deterioration.  Critical care was time spent personally by me on the following activities: development of treatment plan with patient and/or surrogate as well as nursing, discussions with consultants, evaluation of patient's response to treatment, examination of patient, obtaining history from patient or surrogate,  ordering and performing treatments and interventions, ordering and review of laboratory studies, ordering and review of radiographic studies, pulse oximetry and re-evaluation of patient's condition.   MDM   Discussed with Dr Janee Morn who will admit to stepdown       Loren Racer, MD 07/02/12 1351

## 2012-07-02 NOTE — H&P (Signed)
[facility] History and Physical  Alexa Chavez ZOX:[id number] DOB: 21-Sep-1945 DOA: 07/02/2012  Referring physician: Dr Ranae Palms PCP: Lonia Blood, MD  Specialists: Pulmonary and critical care medicine: Dr. Molli Knock  Chief Complaint: Shortness of breath  HPI: Alexa Chavez is a 66 y.o. female with a past medical history of hypertension, hypothyroidism, 40-pack-year tobacco history who presents to the ED with a one-day history of worsening shortness of breath. Patient does endorse some generalized weakened wheezing, a productive cough of yellowish sputum, generalized weakness. Patient denies any fever, no chills, no chest pain, no no abdominal pain, no nausea, no vomiting, no diarrhea, no constipation. Patient does endorse tobacco abuse states that she has cut down to 3 cigarettes a day over the last 2 years. Patient does endorse sick contacts with someone who had the flu. Patient was seen in the ED chest x-ray which was done was worse over a right-sided pneumonia. CBC done showed a leukocytosis with a white count of 11.3. Comprehensive metabolic profile did have a BUN of 25 a creatinine of 1.13 and albumin of 3.1 otherwise was within normal limits. Patient was given some nebulizer treatments, IV Solu-Medrol, and IV Levaquin. Will call to admit the patient for further evaluation and management. On interview of the patient in the room patient has increased work of breathing using accessory muscles of breathing I was speaking in complete sentences.   Review of Systems: The patient denies anorexia, fever, weight loss,, vision loss, decreased hearing, hoarseness, chest pain, syncope, dyspnea on exertion, peripheral edema, balance deficits, hemoptysis, abdominal pain, melena, hematochezia, severe indigestion/heartburn, hematuria, incontinence, genital sores, muscle weakness, suspicious skin lesions, transient blindness, difficulty walking, depression, unusual weight change, abnormal bleeding, enlarged  lymph nodes, angioedema, and breast masses.   Past Medical History  Diagnosis Date  . Hypertension   . Arthritis   . HTN (hypertension) 07/02/2012  . Hypothyroidism 07/02/2012   History reviewed. No pertinent past surgical history. Social History:  reports that she has been smoking.  She does not have any smokeless tobacco history on file. She reports that she does not drink alcohol or use illicit drugs.  Allergies  Allergen Reactions  . Penicillins Swelling    History reviewed. No pertinent family history.   Prior to Admission medications   Medication Sig Start Date End Date Taking? Authorizing Provider  amLODipine (NORVASC) 10 MG tablet Take 10 mg by mouth daily.   Yes Historical Provider, MD  levothyroxine (SYNTHROID, LEVOTHROID) 100 MCG tablet Take 100 mcg by mouth daily.   Yes Historical Provider, MD  naproxen (NAPROSYN) 500 MG tablet Take 500 mg by mouth 2 (two) times daily with a meal.   Yes Historical Provider, MD  oxyCODONE-acetaminophen (PERCOCET) 7.5-325 MG per tablet Take 1 tablet by mouth 4 (four) times daily.   Yes Historical Provider, MD  predniSONE (DELTASONE) 20 MG tablet Take 20 mg by mouth daily.   Yes Historical Provider, MD  QUEtiapine (SEROQUEL) 100 MG tablet Take 100 mg by mouth at bedtime.   Yes Historical Provider, MD   Physical Exam: Filed Vitals:   07/02/12 1008 07/02/12 1015 07/02/12 1045 07/02/12 1100  BP: 98/45  103/69 110/74  Pulse: 99 101 102 101  Temp: 97.4 F (36.3 C)     TempSrc: Oral     Resp: 16 28 31  32  SpO2: 95% 96% 94% 95%     General:  Well-developed well-nourished in acute respiratory distress using accessory muscles of respiration.  Eyes: Pupils equal round and reactive to light and accommodation.  Extraocular movements intact.  ENT: Oropharynx is clear, no lesions, no exudates.  Neck: Supple with no lymphadenopathy.  Cardiovascular: Tachycardic regular rhythm no murmurs rubs or gallops  Respiratory: Using accessory muscles  of respiration. Poor air movement. Minimal to mild expiratory wheezing. No crackles. Some coarse breath sounds in the right base.  Abdomen: Soft, nontender, nondistended, positive bowel sounds  Skin: No rashes or lesions  Musculoskeletal: 5 out of 5 bilateral upper extremity strength. However 5 bilateral lower extremity strength.  Psychiatric: Normal mood. Normal affect. Good insight. Good judgment.  Neurologic: Alert and oriented x3. Cranial nerves II through XII are grossly intact. No focal deficits.  Labs on Admission:  Basic Metabolic Panel:  Lab 07/02/12 1610  NA 138  K 3.7  CL 100  CO2 21  GLUCOSE 92  BUN 25*  CREATININE 1.13*  CALCIUM 8.6  MG --  PHOS --   Liver Function Tests:  Lab 07/02/12 1028  AST 27  ALT 11  ALKPHOS 105  BILITOT 0.7  PROT 7.1  ALBUMIN 3.1*   No results found for this basename: LIPASE:5,AMYLASE:5 in the last 168 hours No results found for this basename: AMMONIA:5 in the last 168 hours CBC:  Lab 07/02/12 1028  WBC 11.3*  NEUTROABS 10.6*  HGB 13.1  HCT 39.4  MCV 100.5*  PLT 252   Cardiac Enzymes:  Lab 07/02/12 1028  CKTOTAL --  CKMB --  CKMBINDEX --  TROPONINI <0.30    BNP (last 3 results) No results found for this basename: PROBNP:3 in the last 8760 hours CBG: No results found for this basename: GLUCAP:5 in the last 168 hours  Radiological Exams on Admission: Dg Chest Port 1 View  07/02/2012  *RADIOLOGY REPORT*  Clinical Data: Shortness of breath, cough and wheezing.  PORTABLE CHEST - 1 VIEW  Comparison: 05/25/2009.  Findings: The heart is mildly enlarged but stable.  There is marked elevation of the left hemidiaphragm with overlying vascular crowding and atelectasis.  Right lower lobe process is likely a combination of infiltrate, edema and atelectasis.  The bony thorax is intact.  IMPRESSION:  1.  Right lower lobe process likely a combination of atelectasis, infiltrate and effusion. 2.  Marked elevation of the left  hemidiaphragm.   Original Report Authenticated By: Rudie Meyer, M.D.     EKG: Independently reviewed. Normal sinus rhythm  Assessment/Plan Principal Problem:  *Acute respiratory failure Active Problems:  COPD exacerbation  CAP (community acquired pneumonia)  HTN (hypertension)  Hypothyroidism  Arthritis   #1 acute respiratory failure Likely multifactorial in nature secondary to be applied pneumonia and acute COPD exacerbation. Patient is using accessory muscles of respiration and has poor movement on examination. Will check ABG stat. Check a sputum Gram stain and culture. Check a urine Legionella in the urine pneumococcus antigen. Will check an influenza panel. Will place empirically on IV steroids, IV Levaquin, nebulizer treatments, oxygen. BIPAP PRN.  May need a CT of the chest. Consult with critical care medicine for further evaluation and management.  #2 community acquired pneumonia Patient presented with shortness of breath, productive cough, chest x-ray worrisome for a right-sided pneumonia. Will check a sputum Gram stain and culture. Will check a urine Legionella antigen. Check a urine pneumococcus antigen. Placed on oxygen, Mucinex, IV Levaquin, nebulizer treatments. Repeat CXR tomorrow.Follow.  #3 acute COPD exacerbation Likely triggered by problem #2. Patient with poor air movement. Patient is tight. Patient with some wheezing. Place on IV steroids, IV Levaquin, nebulizer treatments, oxygen. We'll place on  BiPAP as needed. Consult with pulmonary critical care medicine.  #4 hypertension Patient's systolic blood pressure has been borderline and needed a fluid bolus. Will hold blood pressure medications for now. Hydrate gently. Follow.  #5 hypothyroidism Check a TSH. Continue home dose Synthroid.  #6 prophylaxis PPI for GI prophylaxis. Lovenox for DVT prophylaxis.   Code Status: Full Family Communication: Updated patient no family at bedside Disposition Plan: Admit to  sdu  Time spent: 70 mins  [facility] Pager [phone]  If 7PM-7AM, please contact night-coverage [email or web address] Password [email or web address] 07/02/2012, 12:48 PM

## 2012-07-02 NOTE — ED Notes (Signed)
Critical Care team at bedside for assessment

## 2012-07-02 NOTE — ED Notes (Signed)
Pt states that she is breathing better and feeling much better than when she first arrived;

## 2012-07-02 NOTE — ED Notes (Signed)
Talked to Dr. Janee Morn about pt's Levaquin orders; MD requesting that pt receive 750mg  of Levaquin; pt given 500mg  earlier today upon arrival to ED; pharmacist called about medication per MD request; pharmacist states 250mg  will be sent for pt and pt will be started on 750mg  of Levaquin tomorrow per MD request;

## 2012-07-03 ENCOUNTER — Inpatient Hospital Stay (HOSPITAL_COMMUNITY): Payer: Medicaid Other

## 2012-07-03 DIAGNOSIS — E039 Hypothyroidism, unspecified: Secondary | ICD-10-CM | POA: Diagnosis present

## 2012-07-03 DIAGNOSIS — F17200 Nicotine dependence, unspecified, uncomplicated: Secondary | ICD-10-CM | POA: Diagnosis present

## 2012-07-03 LAB — BASIC METABOLIC PANEL
BUN: 23 mg/dL (ref 6–23)
Calcium: 8.7 mg/dL (ref 8.4–10.5)
GFR calc Af Amer: 80 mL/min — ABNORMAL LOW (ref >90)
GFR calc non Af Amer: 69 mL/min — ABNORMAL LOW (ref >90)
Potassium: 3.7 mEq/L (ref 3.5–5.1)
Sodium: 137 mEq/L (ref 135–145)

## 2012-07-03 LAB — CBC WITH DIFFERENTIAL/PLATELET
Basophils Absolute: 0 10*3/uL (ref 0.0–0.1)
Basophils Relative: 0 % (ref 0–1)
Eosinophils Absolute: 0 10*3/uL (ref 0.0–0.7)
Hemoglobin: 13.2 g/dL (ref 12.0–15.0)
Lymphocytes Relative: 4 % — ABNORMAL LOW (ref 12–46)
MCHC: 33.6 g/dL (ref 30.0–36.0)
Neutrophils Relative %: 94 % — ABNORMAL HIGH (ref 43–77)
Platelets: 269 10*3/uL (ref 150–400)
WBC Morphology: INCREASED

## 2012-07-03 LAB — STREP PNEUMONIAE URINARY ANTIGEN: Strep Pneumo Urinary Antigen: POSITIVE — AB

## 2012-07-03 LAB — T4, FREE: Free T4: 0.65 ng/dL — ABNORMAL LOW (ref 0.80–1.80)

## 2012-07-03 LAB — HIV ANTIBODY (ROUTINE TESTING W REFLEX): HIV: NONREACTIVE

## 2012-07-03 MED ORDER — ADULT MULTIVITAMIN W/MINERALS CH
1.0000 | ORAL_TABLET | Freq: Every day | ORAL | Status: DC
  Administered 2012-07-03 – 2012-07-05 (×3): 1 via ORAL
  Filled 2012-07-03 (×4): qty 1

## 2012-07-03 MED ORDER — METHYLPREDNISOLONE SODIUM SUCC 125 MG IJ SOLR
60.0000 mg | Freq: Three times a day (TID) | INTRAMUSCULAR | Status: DC
  Administered 2012-07-03 – 2012-07-04 (×3): 60 mg via INTRAVENOUS
  Filled 2012-07-03 (×6): qty 0.96

## 2012-07-03 MED ORDER — ENSURE COMPLETE PO LIQD
237.0000 mL | Freq: Two times a day (BID) | ORAL | Status: DC
  Administered 2012-07-03 – 2012-07-04 (×3): 237 mL via ORAL

## 2012-07-03 NOTE — Progress Notes (Signed)
INITIAL NUTRITION ASSESSMENT  DOCUMENTATION CODES Per approved criteria  -Obesity Unspecified   INTERVENTION: 1. Ensure Complete po BID, each supplement provides 350 kcal and 13 grams of protein. 2. MVI daily 3. RD to continue to follow nutrition care plan  NUTRITION DIAGNOSIS: Inadequate oral intake related to poor appetite as evidenced by pt report.   Goal: Pt to meet >/= 90% of their estimated nutrition needs.  Monitor:  weight trends, lab trends, I/O's, PO intake, supplement tolerance  Reason for Assessment: Malnutrition Screening  66 y.o. female  Admitting Dx: Acute respiratory failure  ASSESSMENT: Admitted with PNA. RD drawn to chart 2/2 Malnutrition Screening. Pt reports that she has had a poor appetite for 4 months. She states that her usual weight is 174 lb. Admit weight was 112 lb - suspect error, as this RD re-weighed pt in bed and current weight is 170 lb. This RD updated current weight. She is unable to explain why her intake has been poor. Doesn't ever go an entire day without eating. Likes Ensure and drinks it at home, asking for it to be sent with meals. RD to arrange. Currently eating 50% of meals.  Height: Ht Readings from Last 1 Encounters:  07/02/12 5\' 3"  (1.6 m)   Weight: Wt Readings from Last 1 Encounters:  07/03/12 170 lb (77.111 kg)   Ideal Body Weight: 115 lb/52.3 kg  % Ideal Body Weight: 147%  Wt Readings from Last 10 Encounters:  07/03/12 170 lb (77.111 kg)   Usual Body Weight: 175 lb per pt  % Usual Body Weight: 97%  BMI:  Body mass index is 30.11 kg/(m^2). Obese Class I.  Estimated Nutritional Needs: Kcal: 1650 - 1850 kcal Protein: 70 - 85 grams Fluid: 1.7 - 1.9 liters daily  Skin: intact  Diet Order: General  EDUCATION NEEDS: -No education needs identified at this time   Intake/Output Summary (Last 24 hours) at 07/03/12 1224 Last data filed at 07/03/12 0900  Gross per 24 hour  Intake    270 ml  Output    600 ml  Net    -330 ml    Last BM: PTA  Labs:   Lab 07/03/12 0430 07/02/12 2128 07/02/12 1028  NA 137 -- 138  K 3.7 -- 3.7  CL 102 -- 100  CO2 17* -- 21  BUN 23 -- 25*  CREATININE 0.87 1.01 1.13*  CALCIUM 8.7 -- 8.6  MG -- 1.6 --  PHOS -- -- --  GLUCOSE 195* -- 92    CBG (last 3)  No results found for this basename: GLUCAP:3 in the last 72 hours  Scheduled Meds:   . albuterol  2.5 mg Nebulization Q6H  . Chlorhexidine Gluconate Cloth  6 each Topical Q0600  . enoxaparin (LOVENOX) injection  40 mg Subcutaneous Q24H  . guaiFENesin  1,200 mg Oral BID  . ipratropium  0.5 mg Nebulization Q6H  . levofloxacin (LEVAQUIN) IV  750 mg Intravenous Q24H  . levothyroxine  100 mcg Oral QAC breakfast  . methylPREDNISolone (SOLU-MEDROL) injection  60 mg Intravenous Q8H  . mupirocin ointment  1 application Nasal BID  . naproxen  500 mg Oral BID WC  . QUEtiapine  100 mg Oral QHS    Continuous Infusions:   . sodium chloride 75 mL/hr at 07/03/12 0600    Past Medical History  Diagnosis Date  . Hypertension   . HTN (hypertension) 07/02/2012  . Hypothyroidism 07/02/2012  . COPD (chronic obstructive pulmonary disease)   . Shortness of breath     "  really can't breath when I lay flat" (07/02/2012)  . Pneumonia 07/02/2012    community acquired pneumonia (07/02/2012)  . Headache     "q once in awhile when my BP goes up" (07/02/2012)  . Stroke ~ 2009    denies residual (07/02/2012)  . Arthritis     "bad in my knees" (07/02/2012)  . Depression   . Anxiety     Past Surgical History  Procedure Date  . Total hip arthroplasty ~ 2011    "left" (07/02/2012)    Jarold Motto MS, RD, LDN Pager: [phone] After-hours pager: [phone]

## 2012-07-03 NOTE — Progress Notes (Signed)
Inpatient Diabetes Program Recommendations  AACE/ADA: New Consensus Statement on Inpatient Glycemic Control (2013)  Target Ranges:  Prepandial:   less than 140 mg/dL      Peak postprandial:   less than 180 mg/dL (1-2 hours)      Critically ill patients:  140 - 180 mg/dL   Reason for Visit: Results for Alexa Chavez, Alexa Chavez (MRN [id number]) as of 07/03/2012 09:44  Ref. Range 07/03/2012 04:30  Glucose Latest Range: 70-99 mg/dL 409 (H)   No history of diabetes however patient is on IV Solumedrol.  Fasting CBG elevated. Consider checking CBG's tid with meals and HS.

## 2012-07-03 NOTE — Care Management Note (Signed)
    Page 1 of 1   07/03/2012     1:37:00 PM   CARE MANAGEMENT NOTE 07/03/2012  Patient:  Alexa Chavez   Account Number:  [id number]  Date Initiated:  07/03/2012  Documentation initiated by:  Alexa Chavez  Subjective/Objective Assessment:   adm w resp failure     Action/Plan:   lives w fam, pcp dr Alexa Chavez, chart states has aide that assists. hx w [facility], has bsc and tub bench   Anticipated DC Date:     Anticipated DC Plan:        DC Planning Services  CM consult      Choice offered to / List presented to:             Status of service:   Medicare Important Message given?   (If response is "NO", the following Medicare IM given date fields will be blank) Date Medicare IM given:   Date Additional Medicare IM given:    Discharge Disposition:    Per UR Regulation:  Reviewed for med. necessity/level of care/duration of stay  If discussed at Long Length of Stay Meetings, dates discussed:    Comments:  12/20 1336 Alexa Alexa Chauca [phone]

## 2012-07-03 NOTE — Progress Notes (Signed)
Chaplain responded to spiritual consult from pt requesting prayer. Pt is being treated for pneumonia. Pt states that her daughter is also a pt on this floor. They both came to [facility] yesterday. Pt requested that I pray for her and for her daughter and I did so. Pt requests that I visit her daughter also.

## 2012-07-03 NOTE — Consult Note (Signed)
Patient: Alexa Chavez DOB: 1946-06-03 Date of Admission: 07/02/2012            [facility]   Date of Consult: 07/03/2012 MD requesting consult:  Janee Morn ([facility])  Reason for consult:  resp distress   Brief Summary:   66yo female active smoker with hx HTN, presented 12/19 with 1 week hx SOB, cough productive yellow sputum.  CXR in ER revealed R sided PNA and mild renal insufficiency.  [facility] was called to admit.  Pt later developed increased resp distress, ? needing bipap, wheezing and PCCM asked to consult.   Filed Vitals:   07/03/12 1200 07/03/12 1231 07/03/12 1300 07/03/12 1400  BP: 134/86 139/97 133/83 131/82  Pulse: 108 109 108 109  Temp:  97.8 F (36.6 C)    TempSrc:  Oral    Resp: 29 29 29 31   Height:      Weight:      SpO2: 98% 99% 98% 99%    chest X-ray Dg Chest Port 1 View  07/03/2012  *RADIOLOGY REPORT*  Clinical Data: Shortness of breath, pneumonia, follow-up  PORTABLE CHEST - 1 VIEW  Comparison: Portable chest x-ray of 07/02/2012  Findings: The lungs remain poorly aerated with basilar opacities right greater than left.  There is persistent elevation of the left hemidiaphragm with probable pneumonia and effusion at the right lung base.  Cardiomegaly is stable.  IMPRESSION: No significant change in probable pneumonia at the right lung base with effusion.  Persistent elevation of the left hemidiaphragm.   Original Report Authenticated By: Dwyane Dee, M.D.    Dg Chest Port 1 View  07/02/2012  *RADIOLOGY REPORT*  Clinical Data: Shortness of breath, cough and wheezing.  PORTABLE CHEST - 1 VIEW  Comparison: 05/25/2009.  Findings: The heart is mildly enlarged but stable.  There is marked elevation of the left hemidiaphragm with overlying vascular crowding and atelectasis.  Right lower lobe process is likely a combination of infiltrate, edema and atelectasis.  The bony thorax is intact.  IMPRESSION:  1.  Right lower lobe process likely a combination of atelectasis, infiltrate and  effusion. 2.  Marked elevation of the left hemidiaphragm.   Original Report Authenticated By: Rudie Meyer, M.D.    CBC  Lab 07/03/12 0430 07/02/12 2128 07/02/12 1028  HGB 13.2 12.9 13.1  HCT 39.3 38.2 39.4  WBC 18.3* 15.7* 11.3*  PLT 269 256 252    BMET  Lab 07/03/12 0430 07/02/12 2128 07/02/12 1028  NA 137 -- 138  K 3.7 -- 3.7  CL 102 -- 100  CO2 17* -- 21  GLUCOSE 195* -- 92  BUN 23 -- 25*  CREATININE 0.87 1.01 1.13*  CALCIUM 8.7 -- 8.6  MG -- 1.6 --  PHOS -- -- --    ABG    Component Value Date/Time   PHART 7.396 07/02/2012 1253   PCO2ART 27.5* 07/02/2012 1253   PO2ART 59.0* 07/02/2012 1253   HCO3 16.9* 07/02/2012 1253   TCO2 18 07/02/2012 1253   ACIDBASEDEF 7.0* 07/02/2012 1253   O2SAT 91.0 07/02/2012 1253   EXAM: General: chronically ill appearing female, acutely anxious  Neuro: awake, alert, MAE, no LA  CV:  s1s2 rrr PULM: resps even non labored, cta GI: abd soft, +bs Extremities:  Warm and dry, no edema    IMPRESSION/ PLAN:  Acute respiratory failure r/t CAP +/- AECOPD.   PLAN -  Abx for CAP  Steroids, BD for ?? AECOPD but likely CAP as source of dyspnea given no wheeze,  likely can taper quickly O2 as needed  pulm hygiene  CT chest when patient able to lie flat- r/o perihilar mass but low suspicion after follow up CXR 12/20 F/u CXR  Will need outpt pulm f/u ?COPD  Consider reducing fluid rate    *Care during the described time interval was provided by me and/or other providers on the critical care team. I have reviewed this patient's available data, including medical history, events of note, physical examination and test results as part of my evaluation.  Canary Brim, NP-C [facility] Pgr: [phone] or [phone]  PCCM will see again on Monday.  Patient seen and examined, agree with above note.  I dictated the care and orders written for this patient under my direction.  Koren Bound, M.D. [phone]

## 2012-07-03 NOTE — Progress Notes (Signed)
Pt refused CT Chest WO Contrast in ED and twice during the night. Pt stated " it would be hard to lay flat, because she gets SOB". Will notify AM nurse to follow up with the MD.

## 2012-07-03 NOTE — Progress Notes (Signed)
[facility] PROGRESS NOTE  Alexa Chavez ZOX:[id number] DOB: 11-11-1945 DOA: 07/02/2012 PCP: Lonia Blood, MD  Brief narrative: Alexa Chavez is a 66 y.o. female with a past medical history of hypertension, hypothyroidism, 40-pack-year tobacco history who presents to the ED with a one-day history of worsening shortness of breath. Patient does endorse some generalized weakened wheezing, a productive cough of yellowish sputum, generalized weakness. Patient denies any fever, no chills, no chest pain, no no abdominal pain, no nausea, no vomiting, no diarrhea, no constipation. Patient does endorse tobacco abuse states that she has cut down to 3 cigarettes a day over the last 2 years. Patient does endorse sick contacts with someone who had the flu. Patient was seen in the ED chest x-ray which was done was worse over a right-sided pneumonia. CBC done showed a leukocytosis with a white count of 11.3. Comprehensive metabolic profile did have a BUN of 25 a creatinine of 1.13 and albumin of 3.1 otherwise was within normal limits. Patient was given some nebulizer treatments, IV Solu-Medrol, and IV Levaquin.   Assessment/Plan: Principal Problem:  *Acute respiratory failure Likely multifactorial in nature secondary to be community acquired pneumonia and acute COPD exacerbation. Influenza negative  Active Problems:  COPD exacerbation Improved- taper solumedrol   CAP (community acquired pneumonia) Cont levaquin   HTN (hypertension) Follow off norvasc   Hypothyroidism TSH very high- check free t4   Arthritis On chronic prednisone   Smoker Down to 4 cigarettes per day.   Code Status: full code  Disposition Plan: follow in sdu DVT prophylaxis:lovenox  HPI/Subjective: States she is feeling much better today- coughing up green mucous since yesterday.   Objective: Filed Vitals:   07/03/12 0900 07/03/12 0916 07/03/12 1100 07/03/12 1231  BP: 116/73   139/97  Pulse: 98 103  109  Temp:  97.7  F (36.5 C)  97.8 F (36.6 C)  TempSrc:  Oral  Oral  Resp: 27 29  29   Height:      Weight:   77.111 kg (170 lb)   SpO2: 87% 96%  99%    Intake/Output Summary (Last 24 hours) at 07/03/12 1406 Last data filed at 07/03/12 0900  Gross per 24 hour  Intake    270 ml  Output    600 ml  Net   -330 ml    Exam:   General:  Alert, no distress  Cardiovascular: RRR, no murmurs  Respiratory: crackles in Right middle lung field  Abdomen: soft, NT, ND, BS+  Ext:no c/c/e  Data Reviewed: Basic Metabolic Panel:  Lab 07/03/12 8119 07/02/12 2128 07/02/12 1028  NA 137 -- 138  K 3.7 -- 3.7  CL 102 -- 100  CO2 17* -- 21  GLUCOSE 195* -- 92  BUN 23 -- 25*  CREATININE 0.87 1.01 1.13*  CALCIUM 8.7 -- 8.6  MG -- 1.6 --  PHOS -- -- --   Liver Function Tests:  Lab 07/02/12 1028  AST 27  ALT 11  ALKPHOS 105  BILITOT 0.7  PROT 7.1  ALBUMIN 3.1*   No results found for this basename: LIPASE:5,AMYLASE:5 in the last 168 hours No results found for this basename: AMMONIA:5 in the last 168 hours CBC:  Lab 07/03/12 0430 07/02/12 2128 07/02/12 1028  WBC 18.3* 15.7* 11.3*  NEUTROABS 17.2* -- 10.6*  HGB 13.2 12.9 13.1  HCT 39.3 38.2 39.4  MCV 103.4* 99.5 100.5*  PLT 269 256 252   Cardiac Enzymes:  Lab 07/02/12 1028  CKTOTAL --  CKMB --  CKMBINDEX --  TROPONINI <0.30   BNP (last 3 results)  Basename 07/02/12 2128  PROBNP 3936.0*   CBG: No results found for this basename: GLUCAP:5 in the last 168 hours  Recent Results (from the past 240 hour(s))  MRSA PCR SCREENING     Status: Abnormal   Collection Time   07/02/12  6:46 PM      Component Value Range Status Comment   MRSA by PCR POSITIVE (*) NEGATIVE Final      Studies: Dg Chest Port 1 View  07/03/2012  *RADIOLOGY REPORT*  Clinical Data: Shortness of breath, pneumonia, follow-up  PORTABLE CHEST - 1 VIEW  Comparison: Portable chest x-ray of 07/02/2012  Findings: The lungs remain poorly aerated with basilar opacities  right greater than left.  There is persistent elevation of the left hemidiaphragm with probable pneumonia and effusion at the right lung base.  Cardiomegaly is stable.  IMPRESSION: No significant change in probable pneumonia at the right lung base with effusion.  Persistent elevation of the left hemidiaphragm.   Original Report Authenticated By: Dwyane Dee, M.D.    Dg Chest Port 1 View  07/02/2012  *RADIOLOGY REPORT*  Clinical Data: Shortness of breath, cough and wheezing.  PORTABLE CHEST - 1 VIEW  Comparison: 05/25/2009.  Findings: The heart is mildly enlarged but stable.  There is marked elevation of the left hemidiaphragm with overlying vascular crowding and atelectasis.  Right lower lobe process is likely a combination of infiltrate, edema and atelectasis.  The bony thorax is intact.  IMPRESSION:  1.  Right lower lobe process likely a combination of atelectasis, infiltrate and effusion. 2.  Marked elevation of the left hemidiaphragm.   Original Report Authenticated By: Rudie Meyer, M.D.     Scheduled Meds:   . albuterol  2.5 mg Nebulization Q6H  . Chlorhexidine Gluconate Cloth  6 each Topical Q0600  . enoxaparin (LOVENOX) injection  40 mg Subcutaneous Q24H  . feeding supplement  237 mL Oral BID WC  . guaiFENesin  1,200 mg Oral BID  . ipratropium  0.5 mg Nebulization Q6H  . levofloxacin (LEVAQUIN) IV  750 mg Intravenous Q24H  . levothyroxine  100 mcg Oral QAC breakfast  . methylPREDNISolone (SOLU-MEDROL) injection  60 mg Intravenous Q8H  . multivitamin with minerals  1 tablet Oral Daily  . mupirocin ointment  1 application Nasal BID  . naproxen  500 mg Oral BID WC  . QUEtiapine  100 mg Oral QHS   Continuous Infusions:   . sodium chloride 1,000 mL (07/03/12 1255)    ________________________________________________________________________  Time spent: 35 min    [facility]  [facility] Pager [phone] If 8PM-8AM, please contact night-coverage at [email or web address], password  [facility] 07/03/2012, 2:06 PM  LOS: 1 day

## 2012-07-04 DIAGNOSIS — F17200 Nicotine dependence, unspecified, uncomplicated: Secondary | ICD-10-CM

## 2012-07-04 DIAGNOSIS — J154 Pneumonia due to other streptococci: Secondary | ICD-10-CM

## 2012-07-04 MED ORDER — LEVOTHYROXINE SODIUM 112 MCG PO TABS
112.0000 ug | ORAL_TABLET | Freq: Every day | ORAL | Status: DC
  Administered 2012-07-05: 112 ug via ORAL
  Filled 2012-07-04 (×2): qty 1

## 2012-07-04 MED ORDER — BIOTENE DRY MOUTH MT LIQD
15.0000 mL | Freq: Two times a day (BID) | OROMUCOSAL | Status: DC
  Administered 2012-07-05: 15 mL via OROMUCOSAL

## 2012-07-04 MED ORDER — NICOTINE 7 MG/24HR TD PT24
7.0000 mg | MEDICATED_PATCH | Freq: Every day | TRANSDERMAL | Status: DC
  Administered 2012-07-05: 7 mg via TRANSDERMAL
  Filled 2012-07-04 (×2): qty 1

## 2012-07-04 MED ORDER — PREDNISONE 20 MG PO TABS
40.0000 mg | ORAL_TABLET | Freq: Every day | ORAL | Status: DC
  Filled 2012-07-04 (×2): qty 2

## 2012-07-04 MED ORDER — FAMOTIDINE 20 MG PO TABS
20.0000 mg | ORAL_TABLET | Freq: Two times a day (BID) | ORAL | Status: DC
  Administered 2012-07-04 – 2012-07-05 (×3): 20 mg via ORAL
  Filled 2012-07-04 (×4): qty 1

## 2012-07-04 MED ORDER — AMLODIPINE BESYLATE 10 MG PO TABS
10.0000 mg | ORAL_TABLET | Freq: Every day | ORAL | Status: DC
  Administered 2012-07-04 – 2012-07-05 (×2): 10 mg via ORAL
  Filled 2012-07-04 (×2): qty 1

## 2012-07-04 MED ORDER — GUAIFENESIN ER 600 MG PO TB12
600.0000 mg | ORAL_TABLET | Freq: Two times a day (BID) | ORAL | Status: DC
  Administered 2012-07-04 – 2012-07-05 (×2): 600 mg via ORAL
  Filled 2012-07-04 (×3): qty 1

## 2012-07-04 MED ORDER — LEVOTHYROXINE SODIUM 75 MCG PO TABS
75.0000 ug | ORAL_TABLET | Freq: Every day | ORAL | Status: DC
  Filled 2012-07-04: qty 1

## 2012-07-04 NOTE — Evaluation (Signed)
Physical Therapy Evaluation Patient Details Name: Alexa Chavez MRN: [id number] DOB: [birth year] Today's Date: 07/04/2012 Time: 4098-1191 PT Time Calculation (min): 18 min  PT Assessment / Plan / Recommendation Clinical Impression  Patient is a 66 yo female admitted with acute respiratory failure, COPD, CAP.  Patient with good support at home (24 hour assist with aide 3 hours/day).  Patient with general weakness impacting functional mobility.  Patient will benefit from acute PT to maximize independence and decrease burden of care for caregiver prior to return home.  Recommend HHPT.    PT Assessment  Patient needs continued PT services    Follow Up Recommendations  Home health PT;Supervision/Assistance - 24 hour    Does the patient have the potential to tolerate intense rehabilitation      Barriers to Discharge None      Equipment Recommendations  None recommended by PT    Recommendations for Other Services     Frequency Min 3X/week    Precautions / Restrictions Precautions Precautions: Fall Restrictions Weight Bearing Restrictions: No   Pertinent Vitals/Pain       Mobility  Bed Mobility Bed Mobility: Supine to Sit;Sitting - Scoot to Edge of Bed Supine to Sit: 4: Min assist;With rails;HOB elevated Sitting - Scoot to Edge of Bed: 4: Min guard;With rail Details for Bed Mobility Assistance: Verbal cues for technique and safety. Transfers Transfers: Sit to Stand;Stand to Sit Sit to Stand: 4: Min assist;With upper extremity assist;From bed Stand to Sit: 4: Min assist;With upper extremity assist;With armrests;To chair/3-in-1 Details for Transfer Assistance: Verbal cues for hand placement and technique. Ambulation/Gait Ambulation/Gait Assistance: 4: Min assist Ambulation Distance (Feet): 4 Feet Assistive device: 1 person hand held assist Ambulation/Gait Assistance Details: Patient able to ambulate to chair with min assist for balance and safety. Balance decreased - lost  balance reaching back for chair requiring assist to slowly lower to chair. Gait Pattern: Step-through pattern;Decreased stride length;Trunk flexed;Shuffle General Gait Details: Uses RW at home "most of the time"           PT Diagnosis: Difficulty walking;Generalized weakness  PT Problem List: Decreased strength;Decreased activity tolerance;Decreased balance;Decreased mobility;Decreased knowledge of use of DME;Cardiopulmonary status limiting activity PT Treatment Interventions: DME instruction;Gait training;Stair training;Functional mobility training;Therapeutic exercise;Patient/family education   PT Goals Acute Rehab PT Goals PT Goal Formulation: With patient Time For Goal Achievement: 07/11/12 Potential to Achieve Goals: Good Pt will go Supine/Side to Sit: with supervision;with HOB 0 degrees PT Goal: Supine/Side to Sit - Progress: Goal set today Pt will go Sit to Supine/Side: with supervision;with HOB 0 degrees PT Goal: Sit to Supine/Side - Progress: Goal set today Pt will go Sit to Stand: with supervision;with upper extremity assist PT Goal: Sit to Stand - Progress: Goal set today Pt will Ambulate: 16 - 50 feet;with supervision;with rolling walker PT Goal: Ambulate - Progress: Goal set today Pt will Go Up / Down Stairs: 3-5 stairs;with min assist;with rail(s);with least restrictive assistive device PT Goal: Up/Down Stairs - Progress: Goal set today  Visit Information  Last PT Received On: 07/04/12 Assistance Needed: +1    Subjective Data  Subjective: "My knees aren't hurting today, so I can move better." Patient Stated Goal: To return home.   Prior Functioning  Home Living Lives With: Family (3 grandchildren) Available Help at Discharge: Family;Available 24 hours/day;Personal care attendant (Has aide 3 hours/day) Type of Home: House Home Access: Stairs to enter [employer] of Steps: 3 Entrance Stairs-Rails: Right Home Layout: One level Bathroom Shower/Tub:  Tub/shower unit  Bathroom Toilet: Pharmacist, community: Yes How Accessible: Accessible via walker Home Adaptive Equipment: Shower chair with back;Walker - rolling;Straight cane Prior Function Level of Independence: Needs assistance Needs Assistance: Bathing;Dressing;Meal Prep;Light Housekeeping;Gait Bath: Moderate Dressing: Moderate Meal Prep: Maximal Light Housekeeping: Total Gait Assistance: Uses RW.  Patient reports aide helps her when up walking. Able to Take Stairs?: Yes (with assist) Driving: No Communication Communication: No difficulties    Cognition  Overall Cognitive Status: Appears within functional limits for tasks assessed/performed Arousal/Alertness: Awake/alert Orientation Level: Appears intact for tasks assessed Behavior During Session: [facility] for tasks performed    Extremity/Trunk Assessment Right Upper Extremity Assessment RUE ROM/Strength/Tone: [facility] for tasks assessed RUE Sensation: WFL - Light Touch Left Upper Extremity Assessment LUE ROM/Strength/Tone: WFL for tasks assessed LUE Sensation: WFL - Light Touch Right Lower Extremity Assessment RLE ROM/Strength/Tone: Deficits RLE ROM/Strength/Tone Deficits: Grossly 4/5 strength RLE Sensation: WFL - Light Touch Left Lower Extremity Assessment LLE ROM/Strength/Tone: Deficits LLE ROM/Strength/Tone Deficits: Grossly 4/5 strength LLE Sensation: WFL - Light Touch   Balance    End of Session PT - End of Session Equipment Utilized During Treatment: Gait belt;Oxygen Activity Tolerance: Patient limited by fatigue Patient left: in chair;with call bell/phone within reach Nurse Communication: Mobility status  GP     Vena Austria 07/04/2012, 11:36 AM Durenda Hurt. Renaldo Fiddler, [facility] Acute Rehab Services Pager [phone]

## 2012-07-04 NOTE — Progress Notes (Addendum)
[facility] PROGRESS NOTE  Alexa Chavez ZOX:[id number] DOB: [birth year] DOA: 07/02/2012 PCP: Lonia Blood, MD  Brief narrative: Alexa Chavez is a 66 y.o. female with a past medical history of hypertension, hypothyroidism, 40-pack-year tobacco history who presents to the ED with a one-day history of worsening shortness of breath. Patient does endorse some generalized weakened wheezing, a productive cough of yellowish sputum, generalized weakness. Patient denies any fever, no chills, no chest pain, no no abdominal pain, no nausea, no vomiting, no diarrhea, no constipation. Patient does endorse tobacco abuse states that she has cut down to 3 cigarettes a day over the last 2 years. Patient does endorse sick contacts with someone who had the flu.   Patient was seen in the ED chest x-ray which was done was worse over a right-sided pneumonia. CBC done showed a leukocytosis with a white count of 11.3. Comprehensive metabolic profile did have a BUN of 25 a creatinine of 1.13 and albumin of 3.1 otherwise was within normal limits. Patient was given some nebulizer treatments, IV Solu-Medrol, and IV Levaquin.   Assessment/Plan: Principal Problem:  *Acute respiratory failure Likely multifactorial in nature secondary to be community acquired pneumonia and acute COPD exacerbation. Influenza negative  Active Problems:  COPD exacerbation Improved- switch to Prednisone today-   Strep Pneumonia- RLL Strep antigen positive Cont levaquin   HTN (hypertension) Resume norvasc today   Hypothyroidism TSH high-  free t4 low therefore, is still under treated- synthroid was increased about 2 mo ago Will decrease Synthroid from 100 to 112-  will need to send d/c summary to Dr Mikeal Hawthorne    Arthritis On chronic prednisone and Naprosyn- will taper down eventually to her home dose of 20 mg daily- should be on Pepcid to protect stomach- will start today   Smoker Down to 4 cigarettes per day.   Code Status: full  code  Disposition Plan: transfer to med/surg- start PT, OOB with assistance  DVT prophylaxis:lovenox  HPI/Subjective: States she is feeling even better today- continues to cough but no significant dyspnea  Objective: Filed Vitals:   07/04/12 0500 07/04/12 0700 07/04/12 0800 07/04/12 0831  BP: 120/72 156/99 140/80   Pulse: 93 92    Temp:   97.4 F (36.3 C)   TempSrc:   Oral   Resp: 23 22    Height:      Weight:      SpO2:    98%    Intake/Output Summary (Last 24 hours) at 07/04/12 1010 Last data filed at 07/03/12 1933  Gross per 24 hour  Intake   1095 ml  Output    450 ml  Net    645 ml    Exam:   General:  Alert, no distress  Cardiovascular: RRR, no murmurs  Respiratory: crackles in Right middle/lower lung field  Abdomen: soft, NT, ND, BS+  Ext:no c/c/e  Data Reviewed: Basic Metabolic Panel:  Lab 07/03/12 8119 07/02/12 2128 07/02/12 1028  NA 137 -- 138  K 3.7 -- 3.7  CL 102 -- 100  CO2 17* -- 21  GLUCOSE 195* -- 92  BUN 23 -- 25*  CREATININE 0.87 1.01 1.13*  CALCIUM 8.7 -- 8.6  MG -- 1.6 --  PHOS -- -- --   Liver Function Tests:  Lab 07/02/12 1028  AST 27  ALT 11  ALKPHOS 105  BILITOT 0.7  PROT 7.1  ALBUMIN 3.1*   No results found for this basename: LIPASE:5,AMYLASE:5 in the last 168 hours No results found for this basename:  AMMONIA:5 in the last 168 hours CBC:  Lab 07/03/12 0430 07/02/12 2128 07/02/12 1028  WBC 18.3* 15.7* 11.3*  NEUTROABS 17.2* -- 10.6*  HGB 13.2 12.9 13.1  HCT 39.3 38.2 39.4  MCV 103.4* 99.5 100.5*  PLT 269 256 252   Cardiac Enzymes:  Lab 07/02/12 1028  CKTOTAL --  CKMB --  CKMBINDEX --  TROPONINI <0.30   BNP (last 3 results)  Basename 07/02/12 2128  PROBNP 3936.0*   CBG: No results found for this basename: GLUCAP:5 in the last 168 hours  Recent Results (from the past 240 hour(s))  MRSA PCR SCREENING     Status: Abnormal   Collection Time   07/02/12  6:46 PM      Component Value Range Status Comment    MRSA by PCR POSITIVE (*) NEGATIVE Final      Studies: Dg Chest Port 1 View  07/03/2012  *RADIOLOGY REPORT*  Clinical Data: Shortness of breath, pneumonia, follow-up  PORTABLE CHEST - 1 VIEW  Comparison: Portable chest x-ray of 07/02/2012  Findings: The lungs remain poorly aerated with basilar opacities right greater than left.  There is persistent elevation of the left hemidiaphragm with probable pneumonia and effusion at the right lung base.  Cardiomegaly is stable.  IMPRESSION: No significant change in probable pneumonia at the right lung base with effusion.  Persistent elevation of the left hemidiaphragm.   Original Report Authenticated By: Dwyane Dee, M.D.    Dg Chest Port 1 View  07/02/2012  *RADIOLOGY REPORT*  Clinical Data: Shortness of breath, cough and wheezing.  PORTABLE CHEST - 1 VIEW  Comparison: 05/25/2009.  Findings: The heart is mildly enlarged but stable.  There is marked elevation of the left hemidiaphragm with overlying vascular crowding and atelectasis.  Right lower lobe process is likely a combination of infiltrate, edema and atelectasis.  The bony thorax is intact.  IMPRESSION:  1.  Right lower lobe process likely a combination of atelectasis, infiltrate and effusion. 2.  Marked elevation of the left hemidiaphragm.   Original Report Authenticated By: Rudie Meyer, M.D.     Scheduled Meds:    . albuterol  2.5 mg Nebulization Q6H  . Chlorhexidine Gluconate Cloth  6 each Topical Q0600  . enoxaparin (LOVENOX) injection  40 mg Subcutaneous Q24H  . feeding supplement  237 mL Oral BID WC  . guaiFENesin  1,200 mg Oral BID  . ipratropium  0.5 mg Nebulization Q6H  . levofloxacin (LEVAQUIN) IV  750 mg Intravenous Q24H  . levothyroxine  75 mcg Oral QAC breakfast  . methylPREDNISolone (SOLU-MEDROL) injection  60 mg Intravenous Q8H  . multivitamin with minerals  1 tablet Oral Daily  . mupirocin ointment  1 application Nasal BID  . naproxen  500 mg Oral BID WC  . QUEtiapine  100  mg Oral QHS   Continuous Infusions:   ________________________________________________________________________  Time spent: 30 min    [facility]  [facility] Pager [phone] If 8PM-8AM, please contact night-coverage at [email or web address], password [facility] 07/04/2012, 10:10 AM  LOS: 2 days

## 2012-07-05 DIAGNOSIS — J9600 Acute respiratory failure, unspecified whether with hypoxia or hypercapnia: Secondary | ICD-10-CM

## 2012-07-05 DIAGNOSIS — J449 Chronic obstructive pulmonary disease, unspecified: Secondary | ICD-10-CM

## 2012-07-05 LAB — LEGIONELLA ANTIGEN, URINE: Legionella Antigen, Urine: NEGATIVE

## 2012-07-05 MED ORDER — NICOTINE 7 MG/24HR TD PT24: 1.0000 | MEDICATED_PATCH | Freq: Every day | TRANSDERMAL | Status: DC

## 2012-07-05 MED ORDER — ENSURE COMPLETE PO LIQD
237.0000 mL | Freq: Two times a day (BID) | ORAL | Status: DC
  Administered 2012-07-05: 237 mL via ORAL

## 2012-07-05 MED ORDER — LEVOTHYROXINE SODIUM 112 MCG PO TABS: 112.0000 ug | ORAL_TABLET | Freq: Every day | ORAL | Status: AC

## 2012-07-05 MED ORDER — LEVOFLOXACIN 750 MG PO TABS: 750.0000 mg | ORAL_TABLET | Freq: Every day | ORAL | Status: DC

## 2012-07-05 MED ORDER — NAPROXEN 500 MG PO TABS
500.0000 mg | ORAL_TABLET | Freq: Two times a day (BID) | ORAL | Status: DC
  Administered 2012-07-05: 500 mg via ORAL
  Filled 2012-07-05 (×3): qty 1

## 2012-07-05 MED ORDER — FAMOTIDINE 20 MG PO TABS: 20.0000 mg | ORAL_TABLET | Freq: Two times a day (BID) | ORAL | Status: DC

## 2012-07-05 MED ORDER — GUAIFENESIN ER 600 MG PO TB12: 600.0000 mg | ORAL_TABLET | Freq: Two times a day (BID) | ORAL | Status: DC

## 2012-07-05 MED ORDER — PREDNISONE 20 MG PO TABS
40.0000 mg | ORAL_TABLET | Freq: Every day | ORAL | Status: DC
  Administered 2012-07-05: 40 mg via ORAL
  Filled 2012-07-05 (×2): qty 2

## 2012-07-05 MED ORDER — POLYETHYLENE GLYCOL 3350 17 G PO PACK
17.0000 g | PACK | Freq: Every day | ORAL | Status: DC
Start: 2012-07-05 — End: 2012-07-05
  Administered 2012-07-05: 17 g via ORAL
  Filled 2012-07-05: qty 1

## 2012-07-05 NOTE — Progress Notes (Signed)
IV access removed without difficulty. Patient discharged via wheelchair to car with destination of home in care of grandson. Discharge instructions given and patient verbalized understanding. In stable condition, vital signs stable, no distress noted. Pt discharged by Vernona Rieger, RN.

## 2012-07-05 NOTE — Progress Notes (Signed)
Patient states that family member cannot pick her up at discharge until 1pm.

## 2012-07-05 NOTE — Progress Notes (Signed)
Patient educated on importance of not smoking while using oxygen at home. Explained in great detail the risks associated with smoking in a home with oxygen equipment. Explained to patient that there was a high risk of fire associated with smoking with home oxygen. Pt states understanding. Also informed patient that if she smoke with a nicotine patch in place, she would experience adverse effects. States understanding.

## 2012-07-05 NOTE — Discharge Summary (Signed)
Physician Discharge Summary  Alexa Chavez ZOX:[id number] DOB: [birth year] DOA: 07/02/2012  PCP: Lonia Blood, MD  Admit date: 07/02/2012 Discharge date: 07/05/2012  Time spent: >45 minutes  Recommendations for Outpatient Follow-up:  1. See Dr Mikeal Hawthorne in 1 wk for Pulse ox on exertion- CAN D/C O2 IF PULSE OX STABLE 2. Have increased synthroid to 112 mcg- see lab result below  Discharge Diagnoses:  Principal Problem:  *Acute respiratory failure Active Problems:  COPD exacerbation  Streptococcal pneumonia  HTN (hypertension)  Hypothyroidism  Arthritis  Hypothyroid  Smoker   Discharge Condition: stable  Diet recommendation: heart healthy  Filed Weights   07/02/12 1836 07/03/12 1100 07/04/12 0448  Weight: 51 kg (112 lb 7 oz) 77.111 kg (170 lb) 77.837 kg (171 lb 9.6 oz)    History of present illness:  Alexa Chavez is a 65 y.o. female with a past medical history of hypertension, hypothyroidism, 40-pack-year tobacco history who presents to the ED with a one-day history of worsening shortness of breath. Patient does endorse some generalized weakened wheezing, a productive cough of yellowish sputum, generalized weakness. Patient denies any fever, no chills, no chest pain, no no abdominal pain, no nausea, no vomiting, no diarrhea, no constipation. Patient does endorse tobacco abuse states that she has cut down to 3 cigarettes a day over the last 2 years. Patient does endorse sick contacts with someone who had the flu.   Patient was seen in the ED chest x-ray which was done was worse over a right-sided pneumonia. CBC done showed a leukocytosis with a white count of 11.3. Comprehensive metabolic profile did have a BUN of 25 a creatinine of 1.13 and albumin of 3.1 otherwise was within normal limits. Patient was given some nebulizer treatments, IV Solu-Medrol, and IV Levaquin. Will call to admit the patient for further evaluation and management. On interview of the patient in the room patient has  increased work of breathing using accessory muscles of breathing   Hospital Course:  *Acute respiratory failure  Likely multifactorial in nature secondary to be community acquired pneumonia and acute COPD exacerbation.  Influenza negative   Active Problems:  COPD exacerbation  Improved- switch to Prednisone  - tolerating rapid taper- uses 20 mg daily for arthritis - can resume this tomorrow- was given 40 mg today.  Strep Pneumonia- RLL  Strep antigen positive  Cont levaquin for total 10 days Will need home O2 due to pulse ox of 87% with exertion - still hs a significant amount of RLL crackles   HTN (hypertension)  Cont home med  Hypothyroidism  TSH high at 27.780-  free t4 low at 0.65, therefore, is still under treated - synthroid was increasedabout 2 mo ago  Have increased Synthroid again from 100 to 112-   Arthritis  On chronic prednisone-home dose of 20 mg daily-  Also on Naprosyn BID at high doses - should be on Pepcid to protect stomach- have prescribed  Smoker  Down to 4 cigarettes per day.  Have given her a low dose nicotine patch  Discharge Exam: Filed Vitals:   07/05/12 0500 07/05/12 0700 07/05/12 0800 07/05/12 0836  BP:   142/93   Pulse: 96  102   Temp:   98.2 F (36.8 C)   TempSrc:   Oral   Resp: 18 26 16    Height:      Weight:      SpO2: 96%  97% 97%    General: alert, mildly upset as her older grandchildren are not taking care of  her younger autistic grandchild and asking to go home to help him.  Cardiovascular: RRR, no murmurs Respiratory: crackles in RLL  Discharge Instructions  Discharge Orders    Future Orders Please Complete By Expires   Diet - low sodium heart healthy      Increase activity slowly      Discharge instructions      Comments:   See Dr Mikeal Hawthorne in 1 week to have O2 check when you walk- If > 88 %, you can stop using O2       Medication List     As of 07/05/2012 10:53 AM    TAKE these medications         amLODipine 10  MG tablet   Commonly known as: NORVASC   Take 10 mg by mouth daily.      guaiFENesin 600 MG 12 hr tablet   Commonly known as: MUCINEX   Take 1 tablet (600 mg total) by mouth 2 (two) times daily.      levofloxacin 750 MG tablet   Commonly known as: LEVAQUIN   Take 1 tablet (750 mg total) by mouth daily.      levothyroxine 112 MCG tablet   Commonly known as: SYNTHROID, LEVOTHROID   Take 1 tablet (112 mcg total) by mouth daily before breakfast.      naproxen 500 MG tablet   Commonly known as: NAPROSYN   Take 500 mg by mouth 2 (two) times daily with a meal.      nicotine 7 mg/24hr patch   Commonly known as: NICODERM CQ - dosed in mg/24 hr   Place 1 patch onto the skin daily at 6 (six) AM.      oxyCODONE-acetaminophen 7.5-325 MG per tablet   Commonly known as: PERCOCET   Take 1 tablet by mouth 4 (four) times daily.      predniSONE 20 MG tablet   Commonly known as: DELTASONE   Take 20 mg by mouth daily.      QUEtiapine 100 MG tablet   Commonly known as: SEROQUEL   Take 100 mg by mouth at bedtime.          The results of significant diagnostics from this hospitalization (including imaging, microbiology, ancillary and laboratory) are listed below for reference.    Significant Diagnostic Studies: Dg Chest Port 1 View  07/03/2012  *RADIOLOGY REPORT*  Clinical Data: Pneumonia.  PORTABLE CHEST - 1 VIEW  Comparison: One-view chest 07/03/2012 at 05:07 a.m.  Findings: The heart is enlarged.  Left hemidiaphragm is markedly elevated.  Right lower lobe airspace disease persists.  Bilateral pleural effusions are suspected.  The visualized soft tissues and bony thorax are unremarkable.  IMPRESSION:  1.  Persistent right lower lobe airspace disease compatible with pneumonia. 2.  Marked elevation of the left hemidiaphragm is stable. 3.  Stable cardiomegaly. 4.  Suspect bilateral pleural effusions.   Original Report Authenticated By: Marin Roberts, M.D.    Dg Chest Port 1  View  07/03/2012  *RADIOLOGY REPORT*  Clinical Data: Shortness of breath, pneumonia, follow-up  PORTABLE CHEST - 1 VIEW  Comparison: Portable chest x-ray of 07/02/2012  Findings: The lungs remain poorly aerated with basilar opacities right greater than left.  There is persistent elevation of the left hemidiaphragm with probable pneumonia and effusion at the right lung base.  Cardiomegaly is stable.  IMPRESSION: No significant change in probable pneumonia at the right lung base with effusion.  Persistent elevation of the left hemidiaphragm.   Original Report  Authenticated By: Dwyane Dee, M.D.    Dg Chest Port 1 View  07/02/2012  *RADIOLOGY REPORT*  Clinical Data: Shortness of breath, cough and wheezing.  PORTABLE CHEST - 1 VIEW  Comparison: 05/25/2009.  Findings: The heart is mildly enlarged but stable.  There is marked elevation of the left hemidiaphragm with overlying vascular crowding and atelectasis.  Right lower lobe process is likely a combination of infiltrate, edema and atelectasis.  The bony thorax is intact.  IMPRESSION:  1.  Right lower lobe process likely a combination of atelectasis, infiltrate and effusion. 2.  Marked elevation of the left hemidiaphragm.   Original Report Authenticated By: Rudie Meyer, M.D.     Microbiology: Recent Results (from the past 240 hour(s))  MRSA PCR SCREENING     Status: Abnormal   Collection Time   07/02/12  6:46 PM      Component Value Range Status Comment   MRSA by PCR POSITIVE (*) NEGATIVE Final   CULTURE, BLOOD (ROUTINE X 2)     Status: Normal (Preliminary result)   Collection Time   07/02/12  9:12 PM      Component Value Range Status Comment   Specimen Description BLOOD RIGHT HAND   Final    Special Requests BOTTLES DRAWN AEROBIC ONLY 8CC   Final    Culture  Setup Time 07/03/2012 02:57   Final    Culture     Final    Value:        BLOOD CULTURE RECEIVED NO GROWTH TO DATE CULTURE WILL BE HELD FOR 5 DAYS BEFORE ISSUING A FINAL NEGATIVE REPORT    Report Status PENDING   Incomplete   CULTURE, BLOOD (ROUTINE X 2)     Status: Normal (Preliminary result)   Collection Time   07/02/12  9:27 PM      Component Value Range Status Comment   Specimen Description BLOOD RIGHT HAND   Final    Special Requests BOTTLES DRAWN AEROBIC ONLY 5CC   Final    Culture  Setup Time 07/03/2012 02:57   Final    Culture     Final    Value:        BLOOD CULTURE RECEIVED NO GROWTH TO DATE CULTURE WILL BE HELD FOR 5 DAYS BEFORE ISSUING A FINAL NEGATIVE REPORT   Report Status PENDING   Incomplete      Labs: Basic Metabolic Panel:  Lab 07/03/12 2130 07/02/12 2128 07/02/12 1028  NA 137 -- 138  K 3.7 -- 3.7  CL 102 -- 100  CO2 17* -- 21  GLUCOSE 195* -- 92  BUN 23 -- 25*  CREATININE 0.87 1.01 1.13*  CALCIUM 8.7 -- 8.6  MG -- 1.6 --  PHOS -- -- --   Liver Function Tests:  Lab 07/02/12 1028  AST 27  ALT 11  ALKPHOS 105  BILITOT 0.7  PROT 7.1  ALBUMIN 3.1*   No results found for this basename: LIPASE:5,AMYLASE:5 in the last 168 hours No results found for this basename: AMMONIA:5 in the last 168 hours CBC:  Lab 07/03/12 0430 07/02/12 2128 07/02/12 1028  WBC 18.3* 15.7* 11.3*  NEUTROABS 17.2* -- 10.6*  HGB 13.2 12.9 13.1  HCT 39.3 38.2 39.4  MCV 103.4* 99.5 100.5*  PLT 269 256 252   Cardiac Enzymes:  Lab 07/02/12 1028  CKTOTAL --  CKMB --  CKMBINDEX --  TROPONINI <0.30   BNP: BNP (last 3 results)  Basename 07/02/12 2128  PROBNP 3936.0*   CBG: No results  found for this basename: GLUCAP:5 in the last 168 hours     Signed:  [facility]  [facility] 07/05/2012, 10:53 AM

## 2012-07-05 NOTE — Progress Notes (Signed)
SATURATION QUALIFICATIONS: (This note is used to comply with regulatory documentation for home oxygen)  Patient Saturations on Room Air at Rest = 92%  Patient Saturations on Room Air while Ambulating = 87%  Patient Saturations on 2 Liters of oxygen while Ambulating 93%  Please briefly explain why patient needs home oxygen: 

## 2012-07-05 NOTE — Progress Notes (Signed)
Case manager called to ensure that patient receives home oxygen as ordered by Dr. Butler Denmark. Message left. Awaiting return call from [facility] Case Manager.

## 2012-07-05 NOTE — Progress Notes (Signed)
Patient performing ADL care on room air while sitting in a chair. Pt SOB on exertion. Ambulated about 3 ft in room and oxygen saturation down to 87% - Pt assisted back to chair and relaxation techniques communicated. After about 15 minutes patients oxygen level increased to 92% on room air and sustained. Dr. Butler Denmark notified.

## 2012-07-09 LAB — CULTURE, BLOOD (ROUTINE X 2): Culture: NO GROWTH

## 2014-10-16 IMAGING — CR DG CHEST 1V PORT
1 series · 1 of 1 positions shown · non-contrast
Comparison: One-view chest 07/03/2012 at [DATE] a.m..

CLINICAL DATA: Pneumonia.

PORTABLE CHEST - 1 VIEW

[AP]
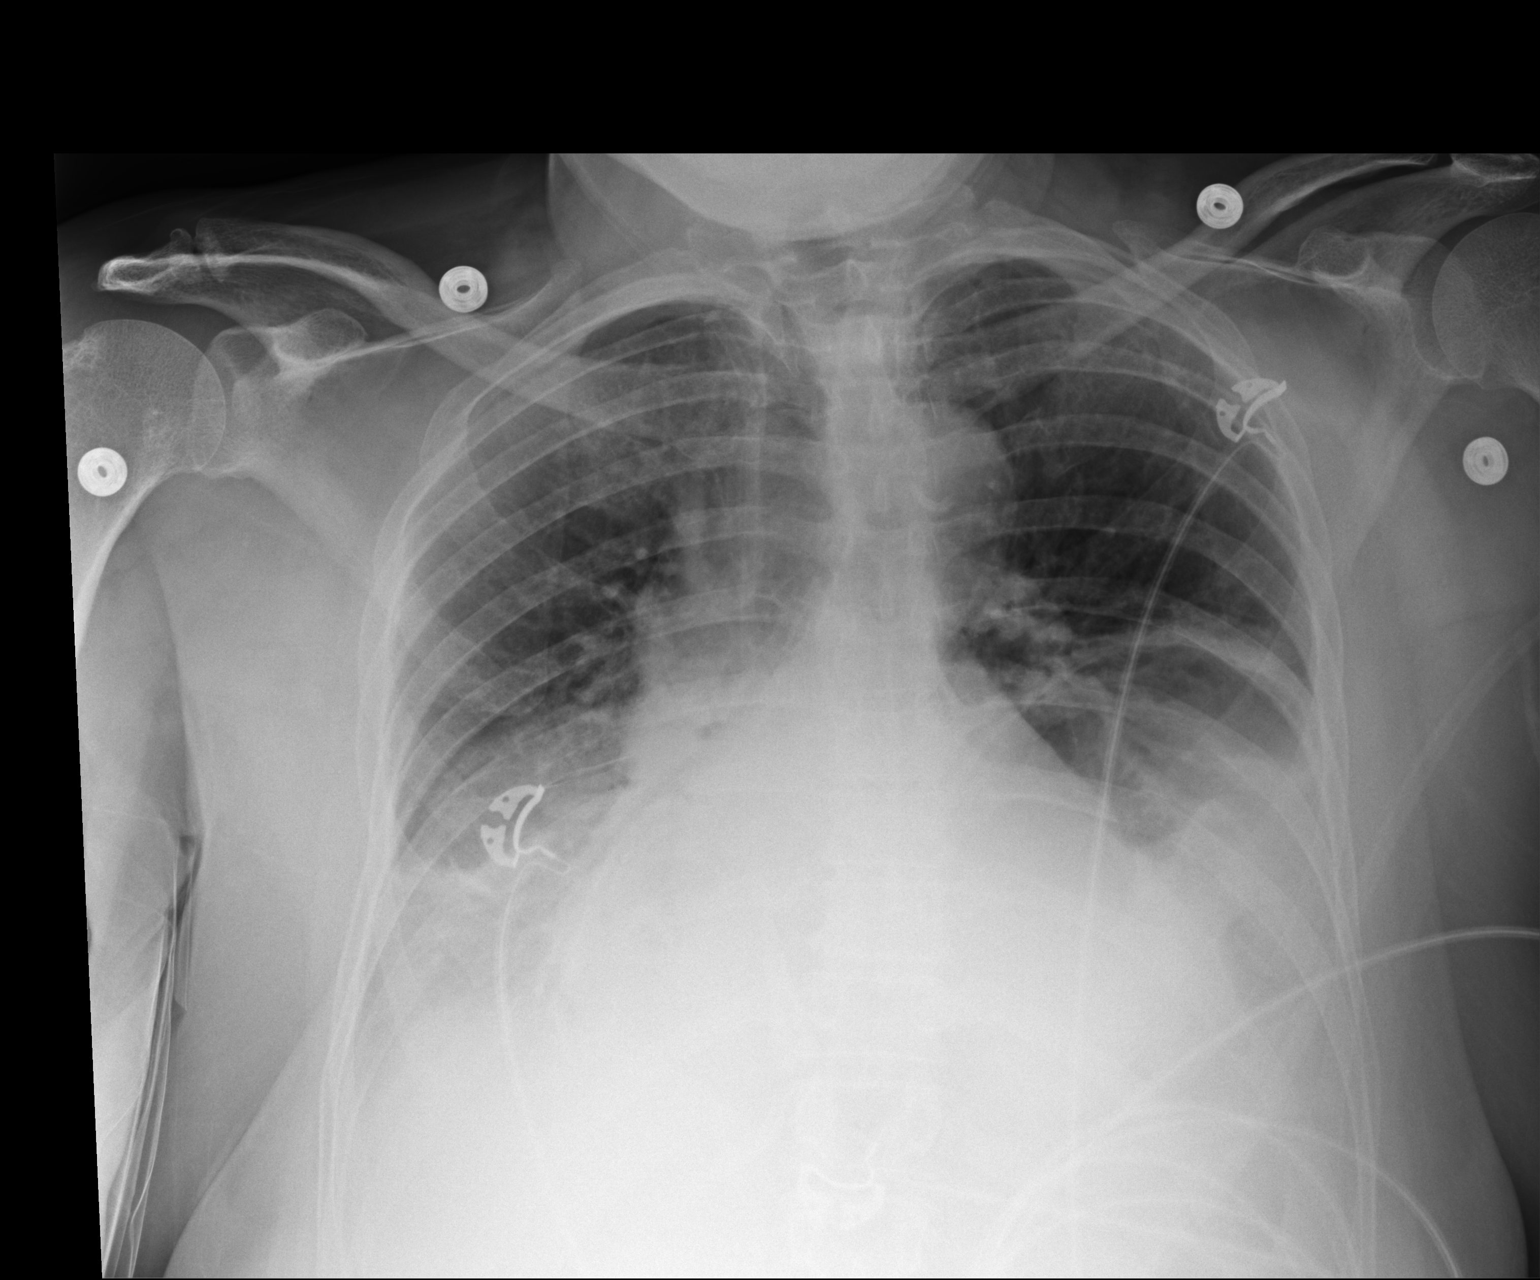

[1 of 1 positions shown; findings below may reference images not displayed]

FINDINGS: The heart is enlarged.  Left hemidiaphragm is markedly
elevated.  Right lower lobe airspace disease persists.  Bilateral
pleural effusions are suspected.  The visualized soft tissues and
bony thorax are unremarkable.
IMPRESSION: 1.  Persistent right lower lobe airspace disease compatible with
pneumonia.
2.  Marked elevation of the left hemidiaphragm is stable.
3.  Stable cardiomegaly.
4.  Suspect bilateral pleural effusions.

## 2015-09-13 ENCOUNTER — Encounter: Payer: Self-pay | Admitting: *Deleted

## 2015-10-09 ENCOUNTER — Encounter: Payer: Medicaid Other | Admitting: Obstetrics & Gynecology

## 2015-11-17 ENCOUNTER — Other Ambulatory Visit: Payer: Self-pay | Admitting: Internal Medicine

## 2015-11-17 DIAGNOSIS — Z1231 Encounter for screening mammogram for malignant neoplasm of breast: Secondary | ICD-10-CM

## 2015-12-04 ENCOUNTER — Ambulatory Visit: Payer: Medicaid Other

## 2016-08-12 ENCOUNTER — Encounter: Payer: Self-pay | Admitting: Gastroenterology

## 2016-09-09 ENCOUNTER — Telehealth: Payer: Self-pay | Admitting: *Deleted

## 2016-09-09 ENCOUNTER — Encounter: Payer: Self-pay | Admitting: *Deleted

## 2016-09-09 NOTE — Telephone Encounter (Signed)
Patient no show PV appointment on 09/05/16. Patient told nurse she would call back to reschedule this but she has not. Called patient this morning, no answer, no answering machine unable to leave a message. Will send no show letter today if patient does not call and reschedule today.

## 2016-09-20 ENCOUNTER — Encounter: Payer: Medicaid Other | Admitting: Gastroenterology

## 2017-09-11 DIAGNOSIS — I10 Essential (primary) hypertension: Secondary | ICD-10-CM | POA: Diagnosis not present

## 2017-09-11 DIAGNOSIS — M170 Bilateral primary osteoarthritis of knee: Secondary | ICD-10-CM | POA: Diagnosis not present

## 2017-09-11 DIAGNOSIS — G894 Chronic pain syndrome: Secondary | ICD-10-CM | POA: Diagnosis not present

## 2017-09-11 DIAGNOSIS — Z79891 Long term (current) use of opiate analgesic: Secondary | ICD-10-CM | POA: Diagnosis not present

## 2017-10-09 DIAGNOSIS — M1990 Unspecified osteoarthritis, unspecified site: Secondary | ICD-10-CM | POA: Diagnosis not present

## 2017-10-09 DIAGNOSIS — G894 Chronic pain syndrome: Secondary | ICD-10-CM | POA: Diagnosis not present

## 2017-10-09 DIAGNOSIS — I10 Essential (primary) hypertension: Secondary | ICD-10-CM | POA: Diagnosis not present

## 2017-10-09 DIAGNOSIS — R509 Fever, unspecified: Secondary | ICD-10-CM | POA: Diagnosis not present

## 2017-11-05 DIAGNOSIS — Y909 Presence of alcohol in blood, level not specified: Secondary | ICD-10-CM | POA: Diagnosis not present

## 2017-11-05 DIAGNOSIS — E782 Mixed hyperlipidemia: Secondary | ICD-10-CM | POA: Diagnosis not present

## 2017-11-05 DIAGNOSIS — E039 Hypothyroidism, unspecified: Secondary | ICD-10-CM | POA: Diagnosis not present

## 2017-12-04 DIAGNOSIS — F17290 Nicotine dependence, other tobacco product, uncomplicated: Secondary | ICD-10-CM | POA: Diagnosis not present

## 2017-12-04 DIAGNOSIS — E039 Hypothyroidism, unspecified: Secondary | ICD-10-CM | POA: Diagnosis not present

## 2017-12-04 DIAGNOSIS — M170 Bilateral primary osteoarthritis of knee: Secondary | ICD-10-CM | POA: Diagnosis not present

## 2017-12-04 DIAGNOSIS — M549 Dorsalgia, unspecified: Secondary | ICD-10-CM | POA: Diagnosis not present

## 2017-12-10 DIAGNOSIS — M47817 Spondylosis without myelopathy or radiculopathy, lumbosacral region: Secondary | ICD-10-CM | POA: Diagnosis not present

## 2017-12-10 DIAGNOSIS — M50321 Other cervical disc degeneration at C4-C5 level: Secondary | ICD-10-CM | POA: Diagnosis not present

## 2017-12-10 DIAGNOSIS — M47816 Spondylosis without myelopathy or radiculopathy, lumbar region: Secondary | ICD-10-CM | POA: Diagnosis not present

## 2017-12-10 DIAGNOSIS — M4316 Spondylolisthesis, lumbar region: Secondary | ICD-10-CM | POA: Diagnosis not present

## 2018-01-06 DIAGNOSIS — F17290 Nicotine dependence, other tobacco product, uncomplicated: Secondary | ICD-10-CM | POA: Diagnosis not present

## 2018-01-06 DIAGNOSIS — M5442 Lumbago with sciatica, left side: Secondary | ICD-10-CM | POA: Diagnosis not present

## 2018-01-06 DIAGNOSIS — E039 Hypothyroidism, unspecified: Secondary | ICD-10-CM | POA: Diagnosis not present

## 2018-01-06 DIAGNOSIS — Z79891 Long term (current) use of opiate analgesic: Secondary | ICD-10-CM | POA: Diagnosis not present

## 2018-01-27 DIAGNOSIS — S335XXA Sprain of ligaments of lumbar spine, initial encounter: Secondary | ICD-10-CM | POA: Diagnosis not present

## 2018-02-02 DIAGNOSIS — M6281 Muscle weakness (generalized): Secondary | ICD-10-CM | POA: Diagnosis not present

## 2018-02-02 DIAGNOSIS — S39012D Strain of muscle, fascia and tendon of lower back, subsequent encounter: Secondary | ICD-10-CM | POA: Diagnosis not present

## 2018-02-02 DIAGNOSIS — R262 Difficulty in walking, not elsewhere classified: Secondary | ICD-10-CM | POA: Diagnosis not present

## 2018-02-02 DIAGNOSIS — M2560 Stiffness of unspecified joint, not elsewhere classified: Secondary | ICD-10-CM | POA: Diagnosis not present

## 2018-02-05 DIAGNOSIS — Z79891 Long term (current) use of opiate analgesic: Secondary | ICD-10-CM | POA: Diagnosis not present

## 2018-02-05 DIAGNOSIS — F3341 Major depressive disorder, recurrent, in partial remission: Secondary | ICD-10-CM | POA: Diagnosis not present

## 2018-02-05 DIAGNOSIS — M549 Dorsalgia, unspecified: Secondary | ICD-10-CM | POA: Diagnosis not present

## 2018-02-06 DIAGNOSIS — S39012D Strain of muscle, fascia and tendon of lower back, subsequent encounter: Secondary | ICD-10-CM | POA: Diagnosis not present

## 2018-02-06 DIAGNOSIS — M6281 Muscle weakness (generalized): Secondary | ICD-10-CM | POA: Diagnosis not present

## 2018-02-06 DIAGNOSIS — M2560 Stiffness of unspecified joint, not elsewhere classified: Secondary | ICD-10-CM | POA: Diagnosis not present

## 2018-02-06 DIAGNOSIS — R262 Difficulty in walking, not elsewhere classified: Secondary | ICD-10-CM | POA: Diagnosis not present

## 2018-02-09 DIAGNOSIS — M2560 Stiffness of unspecified joint, not elsewhere classified: Secondary | ICD-10-CM | POA: Diagnosis not present

## 2018-02-09 DIAGNOSIS — S39012D Strain of muscle, fascia and tendon of lower back, subsequent encounter: Secondary | ICD-10-CM | POA: Diagnosis not present

## 2018-02-09 DIAGNOSIS — R262 Difficulty in walking, not elsewhere classified: Secondary | ICD-10-CM | POA: Diagnosis not present

## 2018-02-09 DIAGNOSIS — M6281 Muscle weakness (generalized): Secondary | ICD-10-CM | POA: Diagnosis not present

## 2018-02-12 DIAGNOSIS — M2560 Stiffness of unspecified joint, not elsewhere classified: Secondary | ICD-10-CM | POA: Diagnosis not present

## 2018-02-12 DIAGNOSIS — S39012D Strain of muscle, fascia and tendon of lower back, subsequent encounter: Secondary | ICD-10-CM | POA: Diagnosis not present

## 2018-02-12 DIAGNOSIS — M6281 Muscle weakness (generalized): Secondary | ICD-10-CM | POA: Diagnosis not present

## 2018-02-12 DIAGNOSIS — M542 Cervicalgia: Secondary | ICD-10-CM | POA: Diagnosis not present

## 2018-02-16 DIAGNOSIS — S39012D Strain of muscle, fascia and tendon of lower back, subsequent encounter: Secondary | ICD-10-CM | POA: Diagnosis not present

## 2018-02-16 DIAGNOSIS — M2560 Stiffness of unspecified joint, not elsewhere classified: Secondary | ICD-10-CM | POA: Diagnosis not present

## 2018-02-16 DIAGNOSIS — R262 Difficulty in walking, not elsewhere classified: Secondary | ICD-10-CM | POA: Diagnosis not present

## 2018-02-16 DIAGNOSIS — M6281 Muscle weakness (generalized): Secondary | ICD-10-CM | POA: Diagnosis not present

## 2018-02-24 DIAGNOSIS — M6281 Muscle weakness (generalized): Secondary | ICD-10-CM | POA: Diagnosis not present

## 2018-02-24 DIAGNOSIS — S39012D Strain of muscle, fascia and tendon of lower back, subsequent encounter: Secondary | ICD-10-CM | POA: Diagnosis not present

## 2018-02-24 DIAGNOSIS — M2560 Stiffness of unspecified joint, not elsewhere classified: Secondary | ICD-10-CM | POA: Diagnosis not present

## 2018-02-24 DIAGNOSIS — M545 Low back pain: Secondary | ICD-10-CM | POA: Diagnosis not present

## 2018-03-11 DIAGNOSIS — F17290 Nicotine dependence, other tobacco product, uncomplicated: Secondary | ICD-10-CM | POA: Diagnosis not present

## 2018-03-11 DIAGNOSIS — M5442 Lumbago with sciatica, left side: Secondary | ICD-10-CM | POA: Diagnosis not present

## 2018-03-11 DIAGNOSIS — M549 Dorsalgia, unspecified: Secondary | ICD-10-CM | POA: Diagnosis not present

## 2018-03-11 DIAGNOSIS — M170 Bilateral primary osteoarthritis of knee: Secondary | ICD-10-CM | POA: Diagnosis not present

## 2018-03-11 DIAGNOSIS — R269 Unspecified abnormalities of gait and mobility: Secondary | ICD-10-CM | POA: Diagnosis not present

## 2018-03-17 DIAGNOSIS — M545 Low back pain: Secondary | ICD-10-CM | POA: Diagnosis not present

## 2018-03-17 DIAGNOSIS — S39012D Strain of muscle, fascia and tendon of lower back, subsequent encounter: Secondary | ICD-10-CM | POA: Diagnosis not present

## 2018-03-17 DIAGNOSIS — M6281 Muscle weakness (generalized): Secondary | ICD-10-CM | POA: Diagnosis not present

## 2018-03-17 DIAGNOSIS — M2560 Stiffness of unspecified joint, not elsewhere classified: Secondary | ICD-10-CM | POA: Diagnosis not present

## 2018-04-08 DIAGNOSIS — R29898 Other symptoms and signs involving the musculoskeletal system: Secondary | ICD-10-CM | POA: Diagnosis not present

## 2018-04-08 DIAGNOSIS — M549 Dorsalgia, unspecified: Secondary | ICD-10-CM | POA: Diagnosis not present

## 2018-04-08 DIAGNOSIS — R269 Unspecified abnormalities of gait and mobility: Secondary | ICD-10-CM | POA: Diagnosis not present

## 2018-06-09 DIAGNOSIS — M170 Bilateral primary osteoarthritis of knee: Secondary | ICD-10-CM | POA: Diagnosis not present

## 2018-06-09 DIAGNOSIS — M5442 Lumbago with sciatica, left side: Secondary | ICD-10-CM | POA: Diagnosis not present

## 2018-06-09 DIAGNOSIS — M549 Dorsalgia, unspecified: Secondary | ICD-10-CM | POA: Diagnosis not present

## 2018-06-09 DIAGNOSIS — R29898 Other symptoms and signs involving the musculoskeletal system: Secondary | ICD-10-CM | POA: Diagnosis not present

## 2018-06-09 DIAGNOSIS — F17290 Nicotine dependence, other tobacco product, uncomplicated: Secondary | ICD-10-CM | POA: Diagnosis not present

## 2018-07-10 DIAGNOSIS — F17290 Nicotine dependence, other tobacco product, uncomplicated: Secondary | ICD-10-CM | POA: Diagnosis not present

## 2018-07-10 DIAGNOSIS — M170 Bilateral primary osteoarthritis of knee: Secondary | ICD-10-CM | POA: Diagnosis not present

## 2018-07-10 DIAGNOSIS — R29898 Other symptoms and signs involving the musculoskeletal system: Secondary | ICD-10-CM | POA: Diagnosis not present

## 2018-07-10 DIAGNOSIS — R269 Unspecified abnormalities of gait and mobility: Secondary | ICD-10-CM | POA: Diagnosis not present

## 2019-06-25 ENCOUNTER — Other Ambulatory Visit: Payer: Self-pay | Admitting: Internal Medicine

## 2019-06-25 DIAGNOSIS — Z1231 Encounter for screening mammogram for malignant neoplasm of breast: Secondary | ICD-10-CM

## 2019-08-16 ENCOUNTER — Ambulatory Visit: Payer: Self-pay

## 2021-01-19 ENCOUNTER — Other Ambulatory Visit: Payer: Self-pay

## 2021-01-19 ENCOUNTER — Ambulatory Visit: Payer: Medicare Other | Admitting: Cardiology

## 2021-01-19 DIAGNOSIS — Z01818 Encounter for other preprocedural examination: Secondary | ICD-10-CM

## 2021-01-19 NOTE — Progress Notes (Addendum)
EKG 01/19/2021: Sinus tachycardia at rate of 101 bpm, normal axis, nonspecific T abnormality. ABNORMAL No significant change from 07/02/2012.  Chief Complaint  Patient presents with   SURGICAL CLEARANCE     ICD-10-CM   1. Pre-op examination  Z01.818 EKG 12-Lead      Alexa Decamp, MD, [facility] 01/19/2021, 10:38 AM Office: [phone] Fax: [phone] Pager: [phone]

## 2021-01-31 ENCOUNTER — Other Ambulatory Visit: Payer: Self-pay | Admitting: Physician Assistant

## 2021-02-16 NOTE — Progress Notes (Signed)
Sent message, via epic in basket, requesting orders in epic from surgeon.  

## 2021-02-17 ENCOUNTER — Ambulatory Visit: Payer: Self-pay | Admitting: Physician Assistant

## 2021-02-17 NOTE — H&P (Signed)
TOTAL KNEE ADMISSION H&P  Patient is being admitted for left total knee arthroplasty.  Subjective:  Chief Complaint:left knee pain.  HPI: Alexa Chavez, 75 y.o. female, has a history of pain and functional disability in the left knee due to arthritis and has failed non-surgical conservative treatments for greater than 12 weeks to includeNSAID's and/or analgesics, corticosteriod injections, use of assistive devices, and activity modification.  Onset of symptoms was gradual, starting 10 years ago with gradually worsening course since that time. The patient noted no past surgery on the left knee(s).  Patient currently rates pain in the left knee(s) at 8 out of 10 with activity. Patient has night pain, worsening of pain with activity and weight bearing, pain that interferes with activities of daily living, pain with passive range of motion, crepitus, and joint swelling.  Patient has evidence of periarticular osteophytes and joint space narrowing by imaging studies.  There is no active infection.  Patient Active Problem List   Diagnosis Date Noted  . Hypothyroid 07/03/2012  . Smoker 07/03/2012  . Acute respiratory failure (HCC) 07/02/2012  . COPD exacerbation (HCC) 07/02/2012  . Streptococcal pneumonia (HCC) 07/02/2012  . HTN (hypertension) 07/02/2012  . Hypothyroidism 07/02/2012  . Arthritis 07/02/2012   Past Medical History:  Diagnosis Date  . Anxiety   . Arthritis    "bad in my knees" (07/02/2012)  . COPD (chronic obstructive pulmonary disease) (HCC)   . Depression   . Headache(784.0)    "q once in awhile when my BP goes up" (07/02/2012)  . HTN (hypertension) 07/02/2012  . Hypertension   . Hypothyroidism 07/02/2012  . Pneumonia 07/02/2012   community acquired pneumonia (07/02/2012)  . Shortness of breath    "really can't breath when I lay flat" (07/02/2012)  . Stroke [facility]) ~ 2009   denies residual (07/02/2012)    Past Surgical History:  Procedure Laterality Date  . TOTAL HIP  ARTHROPLASTY  ~ 2011   "left" (07/02/2012)    Current Outpatient Medications  Medication Sig Dispense Refill Last Dose  . amLODipine (NORVASC) 10 MG tablet Take 10 mg by mouth daily.     . famotidine (PEPCID) 20 MG tablet Take 1 tablet (20 mg total) by mouth 2 (two) times daily. 60 tablet 0   . guaiFENesin (MUCINEX) 600 MG 12 hr tablet Take 1 tablet (600 mg total) by mouth 2 (two) times daily.     Marland Kitchen levofloxacin (LEVAQUIN) 750 MG tablet Take 1 tablet (750 mg total) by mouth daily. 6 tablet 0   . levothyroxine (SYNTHROID, LEVOTHROID) 112 MCG tablet Take 1 tablet (112 mcg total) by mouth daily before breakfast. 30 tablet 0   . naproxen (NAPROSYN) 500 MG tablet Take 500 mg by mouth 2 (two) times daily with a meal.     . nicotine (NICODERM CQ - DOSED IN MG/24 HR) 7 mg/24hr patch Place 1 patch onto the skin daily at 6 (six) AM. 28 patch 0   . oxyCODONE-acetaminophen (PERCOCET) 7.5-325 MG per tablet Take 1 tablet by mouth 4 (four) times daily.     . predniSONE (DELTASONE) 20 MG tablet Take 20 mg by mouth daily.     . QUEtiapine (SEROQUEL) 100 MG tablet Take 100 mg by mouth at bedtime.      No current facility-administered medications for this visit.   Allergies  Allergen Reactions  . Penicillins Anaphylaxis    "my throat started closing up" (07/02/2012)    Social History   Tobacco Use  . Smoking status: Every Day  Packs/day: 0.12    Years: 46.00    Pack years: 5.52    Types: Cigarettes  . Smokeless tobacco: Never  Substance Use Topics  . Alcohol use: Yes    Comment: 07/02/2012 "gin & coke; on occasions (birthdays, etc); not reguarly"     No family history on file.   Review of Systems  Respiratory:  Positive for shortness of breath.   Musculoskeletal:  Positive for arthralgias.  All other systems reviewed and are negative.  Objective:  Physical Exam Constitutional:      General: She is not in acute distress.    Appearance: Normal appearance.  HENT:     Head: Normocephalic  and atraumatic.  Eyes:     Extraocular Movements: Extraocular movements intact.     Pupils: Pupils are equal, round, and reactive to light.  Cardiovascular:     Rate and Rhythm: Normal rate and regular rhythm.     Pulses: Normal pulses.     Heart sounds: Normal heart sounds.  Pulmonary:     Effort: Pulmonary effort is normal. No respiratory distress.     Breath sounds: Normal breath sounds. No wheezing.  Abdominal:     General: Abdomen is flat. Bowel sounds are normal. There is no distension.     Palpations: Abdomen is soft.     Tenderness: There is no abdominal tenderness.  Musculoskeletal:     Cervical back: Normal range of motion and neck supple.     Left knee: Swelling and bony tenderness present. Decreased range of motion. Tenderness present.  Lymphadenopathy:     Cervical: No cervical adenopathy.  Skin:    General: Skin is warm and dry.     Findings: No erythema or rash.  Neurological:     General: No focal deficit present.     Mental Status: She is alert and oriented to person, place, and time.  Psychiatric:        Mood and Affect: Mood normal.        Behavior: Behavior normal.   Vital signs in last 24 hours: @VSRANGES @  Labs:   Estimated body mass index is 30.4 kg/m as calculated from the following:   Height as of 07/02/12: 5\' 3"  (1.6 m).   Weight as of 07/04/12: 77.8 kg.   Imaging Review Plain radiographs demonstrate moderate degenerative joint disease of the left knee(s). The overall alignment isneutral. The bone quality appears to be good for age and reported activity level.      Assessment/Plan:  End stage arthritis, left knee   The patient history, physical examination, clinical judgment of the provider and imaging studies are consistent with end stage degenerative joint disease of the left knee(s) and total knee arthroplasty is deemed medically necessary. The treatment options including medical management, injection therapy arthroscopy and  arthroplasty were discussed at length. The risks and benefits of total knee arthroplasty were presented and reviewed. The risks due to aseptic loosening, infection, stiffness, patella tracking problems, thromboembolic complications and other imponderables were discussed. The patient acknowledged the explanation, agreed to proceed with the plan and consent was signed. Patient is being admitted for inpatient treatment for surgery, pain control, PT, OT, prophylactic antibiotics, VTE prophylaxis, progressive ambulation and ADL's and discharge planning. The patient is planning to be discharged home with home health services    Anticipated LOS equal to or greater than 2 midnights due to - Age 73 and older with one or more of the following:  - Obesity  - Expected need for hospital  services (PT, OT, Nursing) required for safe  discharge  - Anticipated need for postoperative skilled nursing care or inpatient rehab  - Active co-morbidities: Chronic pain requiring opiods, Stroke, and Respiratory Failure/COPD OR   - Unanticipated findings during/Post Surgery: None  - Patient is a high risk of re-admission due to: None

## 2021-02-17 NOTE — H&P (View-Only) (Signed)
TOTAL KNEE ADMISSION H&P  Patient is being admitted for left total knee arthroplasty.  Subjective:  Chief Complaint:left knee pain.  HPI: Alexa Chavez, 75 y.o. female, has a history of pain and functional disability in the left knee due to arthritis and has failed non-surgical conservative treatments for greater than 12 weeks to includeNSAID's and/or analgesics, corticosteriod injections, use of assistive devices, and activity modification.  Onset of symptoms was gradual, starting 10 years ago with gradually worsening course since that time. The patient noted no past surgery on the left knee(s).  Patient currently rates pain in the left knee(s) at 8 out of 10 with activity. Patient has night pain, worsening of pain with activity and weight bearing, pain that interferes with activities of daily living, pain with passive range of motion, crepitus, and joint swelling.  Patient has evidence of periarticular osteophytes and joint space narrowing by imaging studies.  There is no active infection.  Patient Active Problem List   Diagnosis Date Noted  . Hypothyroid 07/03/2012  . Smoker 07/03/2012  . Acute respiratory failure (HCC) 07/02/2012  . COPD exacerbation (HCC) 07/02/2012  . Streptococcal pneumonia (HCC) 07/02/2012  . HTN (hypertension) 07/02/2012  . Hypothyroidism 07/02/2012  . Arthritis 07/02/2012   Past Medical History:  Diagnosis Date  . Anxiety   . Arthritis    "bad in my knees" (07/02/2012)  . COPD (chronic obstructive pulmonary disease) (HCC)   . Depression   . Headache(784.0)    "q once in awhile when my BP goes up" (07/02/2012)  . HTN (hypertension) 07/02/2012  . Hypertension   . Hypothyroidism 07/02/2012  . Pneumonia 07/02/2012   community acquired pneumonia (07/02/2012)  . Shortness of breath    "really can't breath when I lay flat" (07/02/2012)  . Stroke [facility]) ~ 2009   denies residual (07/02/2012)    Past Surgical History:  Procedure Laterality Date  . TOTAL HIP  ARTHROPLASTY  ~ 2011   "left" (07/02/2012)    Current Outpatient Medications  Medication Sig Dispense Refill Last Dose  . amLODipine (NORVASC) 10 MG tablet Take 10 mg by mouth daily.     . famotidine (PEPCID) 20 MG tablet Take 1 tablet (20 mg total) by mouth 2 (two) times daily. 60 tablet 0   . guaiFENesin (MUCINEX) 600 MG 12 hr tablet Take 1 tablet (600 mg total) by mouth 2 (two) times daily.     Marland Kitchen levofloxacin (LEVAQUIN) 750 MG tablet Take 1 tablet (750 mg total) by mouth daily. 6 tablet 0   . levothyroxine (SYNTHROID, LEVOTHROID) 112 MCG tablet Take 1 tablet (112 mcg total) by mouth daily before breakfast. 30 tablet 0   . naproxen (NAPROSYN) 500 MG tablet Take 500 mg by mouth 2 (two) times daily with a meal.     . nicotine (NICODERM CQ - DOSED IN MG/24 HR) 7 mg/24hr patch Place 1 patch onto the skin daily at 6 (six) AM. 28 patch 0   . oxyCODONE-acetaminophen (PERCOCET) 7.5-325 MG per tablet Take 1 tablet by mouth 4 (four) times daily.     . predniSONE (DELTASONE) 20 MG tablet Take 20 mg by mouth daily.     . QUEtiapine (SEROQUEL) 100 MG tablet Take 100 mg by mouth at bedtime.      No current facility-administered medications for this visit.   Allergies  Allergen Reactions  . Penicillins Anaphylaxis    "my throat started closing up" (07/02/2012)    Social History   Tobacco Use  . Smoking status: Every Day  Packs/day: 0.12    Years: 46.00    Pack years: 5.52    Types: Cigarettes  . Smokeless tobacco: Never  Substance Use Topics  . Alcohol use: Yes    Comment: 07/02/2012 "gin & coke; on occasions (birthdays, etc); not reguarly"     No family history on file.   Review of Systems  Respiratory:  Positive for shortness of breath.   Musculoskeletal:  Positive for arthralgias.  All other systems reviewed and are negative.  Objective:  Physical Exam Constitutional:      General: She is not in acute distress.    Appearance: Normal appearance.  HENT:     Head: Normocephalic  and atraumatic.  Eyes:     Extraocular Movements: Extraocular movements intact.     Pupils: Pupils are equal, round, and reactive to light.  Cardiovascular:     Rate and Rhythm: Normal rate and regular rhythm.     Pulses: Normal pulses.     Heart sounds: Normal heart sounds.  Pulmonary:     Effort: Pulmonary effort is normal. No respiratory distress.     Breath sounds: Normal breath sounds. No wheezing.  Abdominal:     General: Abdomen is flat. Bowel sounds are normal. There is no distension.     Palpations: Abdomen is soft.     Tenderness: There is no abdominal tenderness.  Musculoskeletal:     Cervical back: Normal range of motion and neck supple.     Left knee: Swelling and bony tenderness present. Decreased range of motion. Tenderness present.  Lymphadenopathy:     Cervical: No cervical adenopathy.  Skin:    General: Skin is warm and dry.     Findings: No erythema or rash.  Neurological:     General: No focal deficit present.     Mental Status: She is alert and oriented to person, place, and time.  Psychiatric:        Mood and Affect: Mood normal.        Behavior: Behavior normal.   Vital signs in last 24 hours: @VSRANGES @  Labs:   Estimated body mass index is 30.4 kg/m as calculated from the following:   Height as of 07/02/12: 5\' 3"  (1.6 m).   Weight as of 07/04/12: 77.8 kg.   Imaging Review Plain radiographs demonstrate moderate degenerative joint disease of the left knee(s). The overall alignment isneutral. The bone quality appears to be good for age and reported activity level.      Assessment/Plan:  End stage arthritis, left knee   The patient history, physical examination, clinical judgment of the provider and imaging studies are consistent with end stage degenerative joint disease of the left knee(s) and total knee arthroplasty is deemed medically necessary. The treatment options including medical management, injection therapy arthroscopy and  arthroplasty were discussed at length. The risks and benefits of total knee arthroplasty were presented and reviewed. The risks due to aseptic loosening, infection, stiffness, patella tracking problems, thromboembolic complications and other imponderables were discussed. The patient acknowledged the explanation, agreed to proceed with the plan and consent was signed. Patient is being admitted for inpatient treatment for surgery, pain control, PT, OT, prophylactic antibiotics, VTE prophylaxis, progressive ambulation and ADL's and discharge planning. The patient is planning to be discharged home with home health services    Anticipated LOS equal to or greater than 2 midnights due to - Age 73 and older with one or more of the following:  - Obesity  - Expected need for hospital  services (PT, OT, Nursing) required for safe  discharge  - Anticipated need for postoperative skilled nursing care or inpatient rehab  - Active co-morbidities: Chronic pain requiring opiods, Stroke, and Respiratory Failure/COPD OR   - Unanticipated findings during/Post Surgery: None  - Patient is a high risk of re-admission due to: None

## 2021-02-21 NOTE — Progress Notes (Signed)
DUE TO COVID-19 ONLY ONE VISITOR IS ALLOWED TO COME WITH YOU AND STAY IN THE WAITING ROOM ONLY DURING PRE OP AND PROCEDURE DAY OF SURGERY.  2 VISITOR  MAY VISIT WITH YOU AFTER SURGERY IN YOUR PRIVATE ROOM DURING VISITING HOURS ONLY!  YOU NEED TO HAVE A COVID 19 TEST ON__ 03/06/2021 ____@_  @_from  8am-3pm _____, THIS TEST MUST BE DONE BEFORE SURGERY,  Covid test is done at [address] [place], [place] [address].  This is a drive thru.  No appt required. Please see map.                 Your procedure is scheduled on:           03/09/2021   Report to [facility] Main  Entrance   Report to admitting at   0515 AM     Call this number if you have problems the morning of surgery [phone]    REMEMBER: NO  SOLID FOOD CANDY OR GUM AFTER MIDNIGHT. CLEAR LIQUIDS UNTIL     0430am       . NOTHING BY MOUTH EXCEPT CLEAR LIQUIDS UNTIL    0430am    . PLEASE FINISH ENSURE DRINK PER SURGEON ORDER  WHICH NEEDS TO BE COMPLETED AT  0430am     .      CLEAR LIQUID DIET   Foods Allowed                                                                    Coffee and tea, regular and decaf                            Fruit ices (not with fruit pulp)                                      Iced Popsicles                                    Carbonated beverages, regular and diet                                    Cranberry, grape and apple juices Sports drinks like Gatorade Lightly seasoned clear broth or consume(fat free) Sugar, honey syrup ___________________________________________________________________      BRUSH YOUR TEETH MORNING OF SURGERY AND RINSE YOUR MOUTH OUT, NO CHEWING GUM CANDY OR MINTS.     Take these medicines the morning of surgery with A SIP OF WATER:    Amlodipine, pepcid, synthroid  DO NOT TAKE ANY DIABETIC MEDICATIONS DAY OF YOUR SURGERY                               You may not have any metal on your body including hair pins and              piercings  Do not wear  jewelry, make-up, lotions, powders or  perfumes, deodorant             Do not wear nail polish on your fingernails.  Do not shave  48 hours prior to surgery.              Men may shave face and neck.   Do not bring valuables to the hospital. [facility] IS NOT             RESPONSIBLE   FOR VALUABLES.  Contacts, dentures or bridgework may not be worn into surgery.  Leave suitcase in the car. After surgery it may be brought to your room.     Patients discharged the day of surgery will not be allowed to drive home. IF YOU ARE HAVING SURGERY AND GOING HOME THE SAME DAY, YOU MUST HAVE AN ADULT TO DRIVE YOU HOME AND BE WITH YOU FOR 24 HOURS. YOU MAY GO HOME BY TAXI OR UBER OR ORTHERWISE, BUT AN ADULT MUST ACCOMPANY YOU HOME AND STAY WITH YOU FOR 24 HOURS.  Name and phone number of your driver:  Special Instructions: N/A              Please read over the following fact sheets you were given: _____________________________________________________________________  [facility] - Preparing for Surgery Before surgery, you can play an important role.  Because skin is not sterile, your skin needs to be as free of germs as possible.  You can reduce the number of germs on your skin by washing with CHG (chlorahexidine gluconate) soap before surgery.  CHG is an antiseptic cleaner which kills germs and bonds with the skin to continue killing germs even after washing. Please DO NOT use if you have an allergy to CHG or antibacterial soaps.  If your skin becomes reddened/irritated stop using the CHG and inform your nurse when you arrive at Short Stay. Do not shave (including legs and underarms) for at least 48 hours prior to the first CHG shower.  You may shave your face/neck. Please follow these instructions carefully:  1.  Shower with CHG Soap the night before surgery and the  morning of Surgery.  2.  If you choose to wash your hair, wash your hair first as usual with your  normal  shampoo.  3.  After you shampoo,  rinse your hair and body thoroughly to remove the  shampoo.                           4.  Use CHG as you would any other liquid soap.  You can apply chg directly  to the skin and wash                       Gently with a scrungie or clean washcloth.  5.  Apply the CHG Soap to your body ONLY FROM THE NECK DOWN.   Do not use on face/ open                           Wound or open sores. Avoid contact with eyes, ears mouth and genitals (private parts).                       Wash face,  Genitals (private parts) with your normal soap.             6.  Wash thoroughly, paying special attention to the area where  your surgery  will be performed.  7.  Thoroughly rinse your body with warm water from the neck down.  8.  DO NOT shower/wash with your normal soap after using and rinsing off  the CHG Soap.                9.  Pat yourself dry with a clean towel.            10.  Wear clean pajamas.            11.  Place clean sheets on your bed the night of your first shower and do not  sleep with pets. Day of Surgery : Do not apply any lotions/deodorants the morning of surgery.  Please wear clean clothes to the hospital/surgery center.  FAILURE TO FOLLOW THESE INSTRUCTIONS MAY RESULT IN THE CANCELLATION OF YOUR SURGERY PATIENT SIGNATURE_________________________________  NURSE SIGNATURE__________________________________  ________________________________________________________________________

## 2021-02-26 ENCOUNTER — Encounter (HOSPITAL_COMMUNITY)
Admission: RE | Admit: 2021-02-26 | Discharge: 2021-02-26 | Disposition: A | Discharge status: Home / Self Care | Payer: Medicare Other | Source: Ambulatory Visit | Attending: Orthopedic Surgery | Admitting: Orthopedic Surgery

## 2021-02-26 ENCOUNTER — Other Ambulatory Visit: Payer: Self-pay

## 2021-02-26 ENCOUNTER — Encounter (HOSPITAL_COMMUNITY): Payer: Self-pay

## 2021-02-26 DIAGNOSIS — M1712 Unilateral primary osteoarthritis, left knee: Secondary | ICD-10-CM | POA: Diagnosis not present

## 2021-02-26 DIAGNOSIS — Z7901 Long term (current) use of anticoagulants: Secondary | ICD-10-CM | POA: Insufficient documentation

## 2021-02-26 DIAGNOSIS — Z01812 Encounter for preprocedural laboratory examination: Secondary | ICD-10-CM | POA: Insufficient documentation

## 2021-02-26 DIAGNOSIS — I10 Essential (primary) hypertension: Secondary | ICD-10-CM | POA: Insufficient documentation

## 2021-02-26 DIAGNOSIS — Z79899 Other long term (current) drug therapy: Secondary | ICD-10-CM | POA: Insufficient documentation

## 2021-02-26 DIAGNOSIS — Z8673 Personal history of transient ischemic attack (TIA), and cerebral infarction without residual deficits: Secondary | ICD-10-CM | POA: Insufficient documentation

## 2021-02-26 HISTORY — DX: Gastro-esophageal reflux disease without esophagitis: K21.9

## 2021-02-26 LAB — COMPREHENSIVE METABOLIC PANEL
ALT: 13 U/L (ref 0–44)
AST: 24 U/L (ref 15–41)
Albumin: 3.8 g/dL (ref 3.5–5.0)
Alkaline Phosphatase: 41 U/L (ref 38–126)
Anion gap: 14 (ref 5–15)
BUN: 15 mg/dL (ref 8–23)
CO2: 21 mmol/L — ABNORMAL LOW (ref 22–32)
Calcium: 8.2 mg/dL — ABNORMAL LOW (ref 8.9–10.3)
Chloride: 105 mmol/L (ref 98–111)
Creatinine, Ser: 1.2 mg/dL — ABNORMAL HIGH (ref 0.44–1.00)
GFR, Estimated: 48 mL/min — ABNORMAL LOW (ref >60)
Glucose, Bld: 118 mg/dL — ABNORMAL HIGH (ref 70–99)
Potassium: 2.9 mmol/L — ABNORMAL LOW (ref 3.5–5.1)
Sodium: 140 mmol/L (ref 135–145)
Total Bilirubin: 0.6 mg/dL (ref 0.3–1.2)
Total Protein: 7.5 g/dL (ref 6.5–8.1)

## 2021-02-26 LAB — CBC WITH DIFFERENTIAL/PLATELET
Abs Immature Granulocytes: 0.04 10*3/uL (ref 0.00–0.07)
Basophils Absolute: 0 10*3/uL (ref 0.0–0.1)
Basophils Relative: 1 %
Eosinophils Absolute: 0 10*3/uL (ref 0.0–0.5)
Eosinophils Relative: 0 %
HCT: 40.7 % (ref 36.0–46.0)
Hemoglobin: 13.9 g/dL (ref 12.0–15.0)
Immature Granulocytes: 1 %
Lymphocytes Relative: 42 %
Lymphs Abs: 2.2 10*3/uL (ref 0.7–4.0)
MCH: 35.8 pg — ABNORMAL HIGH (ref 26.0–34.0)
MCHC: 34.2 g/dL (ref 30.0–36.0)
MCV: 104.9 fL — ABNORMAL HIGH (ref 80.0–100.0)
Monocytes Absolute: 0.3 10*3/uL (ref 0.1–1.0)
Monocytes Relative: 6 %
Neutro Abs: 2.7 10*3/uL (ref 1.7–7.7)
Neutrophils Relative %: 50 %
Platelets: 237 10*3/uL (ref 150–400)
RBC: 3.88 MIL/uL (ref 3.87–5.11)
RDW: 13.6 % (ref 11.5–15.5)
WBC: 5.3 10*3/uL (ref 4.0–10.5)
nRBC: 0 % (ref 0.0–0.2)

## 2021-02-26 LAB — SURGICAL PCR SCREEN
MRSA, PCR: POSITIVE — AB
Staphylococcus aureus: POSITIVE — AB

## 2021-02-26 LAB — PROTIME-INR
INR: 1.1 (ref 0.8–1.2)
Prothrombin Time: 13.9 seconds (ref 11.4–15.2)

## 2021-02-26 LAB — APTT: aPTT: 30 seconds (ref 24–36)

## 2021-02-26 LAB — GLUCOSE, CAPILLARY: Glucose-Capillary: 132 mg/dL — ABNORMAL HIGH (ref 70–99)

## 2021-02-26 NOTE — Progress Notes (Addendum)
Anesthesia Review:  PCP:  DR Earlie Lou  Cardiologist : 01/19/2021 DR Jacinto Halim- preop exam  Chest x-ray : EKG : 01/19/21  and 02/26/2021  Echo : Stress test: Cardiac Cath :  Activity level: can do a flight of stairs without difficulty  Sleep Study/ CPAP : none  Fasting Blood Sugar :      / Checks Blood Sugar -- times a day:   Blood Thinner/ Instructions /Last Dose: ASA / Instructions/ Last Dose :   Covid Test on 03/06/21  Nurse when out to get pt in waiting area for preop appt.  Daughter with pt ,  PT states " I need my blood pressure and my sugar checked I feel funny".  Asked pt what " feeling funny " means? PT states she is feeling dizzy.  PT in a wheelchair.  PT taken back to PST dept.  Vital signs and glucose checked.  PT in no acute distress.  126/73, temp-98.4, pulse- 80 REsp- 16.  Glucose 132.  PT denies being diabetic  PT states she has not had anything to eat or drink this am.   PT states she has felt this way before when blood pressure has been elevated.  Denies any chest pain or shortness of breath or headache.  PT denies any blurred vision.  PT denies any covid symptoms.   Made Jeanette Caprice aware of above.  Instructed pt under direction of Jeanette Caprice she could complete preop if she felt she could and she could go to PCP office for followup the ER or the Urgent Care.  PT stated she wished to proceed with preop appt.  Preop appt completed and EKG done- shown to Rica Mast, NP no further orders given.  PT given peanut butter crackers and bottled water.  Made daughter aware of all vital signs and that pt could go to ER or Urgent Care or PCP for followup with dizziness.  Dtr with pt and voiced understanding.   Reviewed preop covid instructions with daughter and location.   PT reports she is to have upcoming cataract surgery 03/2021.   CMp done 02/26/21 routed to Dr Madelon Lips  Potassium 2.9 on preop labs of 02/26/21.  Called office of DR Vedia Pereyra and had ot leave voice mail message inr  egards to potassium f 2.9 on pt and that complalned of dizziness at preop appt.  Vital signs and potassium of 2.9 left on voice mail messa ge with a call back number of [phone].  Rica Mast, NP asked me to call pt at home and let her know what Potassium is.  Pt was told Potassium was 2.9 and she needed to contact PCP  or URgent Care or ED.  PT voiced understanding.  Informed pt that I had left a voice mail message for DR Mikeal Hawthorne in regards to potassium of 2.9 and pt complaining of dizziness at preop appt.   Called pt at home and pt informed nurse that she takes a potassium pill every day.  Potassium is not listed on her med list in epic.  PT states she takes every nite.  Pt states she has not taken today and she will take it now.  Asked pt when last time she had taked potassium and pt stated a few days ago.  PT could not give nurse an exact date.  PT stated she would take her dose of Potassium now and  knows to call PCP or go to URgent Care or Ed.  PT could not tell nurse  dose of Potassium.  Medication bottle was not available.   Will call pt this pm and again in am of 02/27/21 to check on status.  Called pt on 02/26/21 at 1600pm and pt reports she fees better.  PT stated she had spoken with Dr Maxwell Caul nurse and the nurse told her to eat a banana and that MD will be seeing her in office prior to surgery and nurse would call back with appt.

## 2021-02-27 NOTE — Progress Notes (Signed)
Called pt and checked on status of pt since potassium was 2.9 on 02/26/2021.  Pt states she feels better than yesterday and that she has taken potassium today .  Patient has appt on 02/28/2021 at 1000am with PCP per pt.

## 2021-02-28 NOTE — Anesthesia Preprocedure Evaluation (Addendum)
Anesthesia Evaluation  Patient identified by MRN, date of birth, ID band Patient awake    Reviewed: Allergy & Precautions, NPO status , Patient's Chart, lab work & pertinent test results  Airway Mallampati: II  TM Distance: >3 FB Neck ROM: Full    Dental  (+) Edentulous Lower, Edentulous Upper   Pulmonary COPD, Current Smoker and Patient abstained from smoking.,    Pulmonary exam normal breath sounds clear to auscultation       Cardiovascular hypertension, Pt. on medications Normal cardiovascular exam Rhythm:Regular Rate:Normal  EKG reviewed   Neuro/Psych PSYCHIATRIC DISORDERS Anxiety Depression CVA, No Residual Symptoms    GI/Hepatic GERD  ,  Endo/Other  Hypothyroidism   Renal/GU      Musculoskeletal  (+) Arthritis ,   Abdominal   Peds  Hematology   Anesthesia Other Findings   Reproductive/Obstetrics                           Anesthesia Physical Anesthesia Plan  ASA: 3  Anesthesia Plan: MAC and Spinal   Post-op Pain Management:  Regional for Post-op pain   Induction: Intravenous  PONV Risk Score and Plan: Treatment may vary due to age or medical condition, TIVA, Propofol infusion and Ondansetron  Airway Management Planned: Natural Airway  Additional Equipment: None  Intra-op Plan:   Post-operative Plan:   Informed Consent: I have reviewed the patients History and Physical, chart, labs and discussed the procedure including the risks, benefits and alternatives for the proposed anesthesia with the patient or authorized representative who has indicated his/her understanding and acceptance.       Plan Discussed with: CRNA, Anesthesiologist and Surgeon  Anesthesia Plan Comments: (Adductor canal block. Spinal. GA/LMA as backup. Norton Blizzard, MD  )      Anesthesia Quick Evaluation

## 2021-02-28 NOTE — Progress Notes (Signed)
Anesthesia Chart Review:   Case: [id number] Date/Time: 03/09/21 0715   Procedure: TOTAL KNEE ARTHROPLASTY (Left: Knee)   Anesthesia type: Choice   Pre-op diagnosis: OA LEFT KNEE   Location: [facility] ROOM 05 / [facility]   Surgeons: Frederico Hamman, MD       DISCUSSION: Pt is 75 years old with hx HTN, stroke  Pt reported feeling dizzy at presurgical testing 02/26/21. VSS on assessment. Glucose 132. Pt had not in the morning eaten prior to appointment. Denied chest pain or SOB, headache or visual change. Pt instructed to f/u that day with PCP or if she felt truly ill, urgent care or ED.   K 2.9. At presurgical testing appointment, pt did not report she is to be taking KCl and has not been taking it recently. Pt instructed by RN to resume KCl as prescribed and see PCP. PCP notified by RN of K level.   RN phone call to patient later in day who reported feeling better. Pt reported she had spoken with PCP's office and they instructed pt to eat a banana and appt with PCP scheduled for 02/28/21.    VS: BP 126/73   Pulse 80   Temp 36.9 C (Oral)   Resp 16   Ht 5\' 3"  (1.6 m)   SpO2 99%   BMI 30.40 kg/m   PROVIDERS: - PCP is  , MD   LABS:  - K 2.9. see notes above. Will recheck K day of surgery  (all labs ordered are listed, but only abnormal results are displayed)  Labs Reviewed  SURGICAL PCR SCREEN - Abnormal; Notable for the following components:      Result Value   MRSA, PCR POSITIVE (*)    Staphylococcus aureus POSITIVE (*)    All other components within normal limits  CBC WITH DIFFERENTIAL/PLATELET - Abnormal; Notable for the following components:   MCV 104.9 (*)    MCH 35.8 (*)    All other components within normal limits  COMPREHENSIVE METABOLIC PANEL - Abnormal; Notable for the following components:   Potassium 2.9 (*)    CO2 21 (*)    Glucose, Bld 118 (*)    Creatinine, Ser 1.20 (*)    Calcium 8.2 (*)    GFR, Estimated 48 (*)    All other components within  normal limits  GLUCOSE, CAPILLARY - Abnormal; Notable for the following components:   Glucose-Capillary 132 (*)    All other components within normal limits  PROTIME-INR  APTT     EKG 02/26/21: NSR with sinus arrhythmia. Abnormal QRS-T angle, consider primary T wave abnormality. Appears stable compared with prior tracing.    Past Medical History:  Diagnosis Date   Anxiety    Arthritis    "bad in my knees" (07/02/2012)   Depression    GERD (gastroesophageal reflux disease)    HTN (hypertension) 07/02/2012   Hypertension    Hypothyroidism 07/02/2012   Pneumonia 07/02/2012   community acquired pneumonia (07/02/2012)   Stroke (HCC) ~ 2009   denies residual (07/02/2012)    Past Surgical History:  Procedure Laterality Date   TOTAL HIP ARTHROPLASTY  ~ 2011   "left" (07/02/2012)    MEDICATIONS:  atorvastatin (LIPITOR) 20 MG tablet   famotidine (PEPCID) 20 MG tablet   guaiFENesin (MUCINEX) 600 MG 12 hr tablet   levofloxacin (LEVAQUIN) 750 MG tablet   levothyroxine (SYNTHROID, LEVOTHROID) 112 MCG tablet   metoprolol tartrate (LOPRESSOR) 25 MG tablet   naproxen (NAPROSYN) 500 MG tablet  nicotine (NICODERM CQ - DOSED IN MG/24 HR) 7 mg/24hr patch   nortriptyline (PAMELOR) 50 MG capsule   omeprazole (PRILOSEC) 40 MG capsule   oxyCODONE-acetaminophen (PERCOCET) 7.5-325 MG per tablet   predniSONE (DELTASONE) 20 MG tablet   QUEtiapine (SEROQUEL) 100 MG tablet   No current facility-administered medications for this encounter.    If labs acceptable day of surgery, I anticipate pt can proceed with surgery as scheduled.  Rica Mast, PhD, FNP-BC [facility]/Anesthesiology Phone: [phone] 02/28/2021 10:33 AM

## 2021-03-06 ENCOUNTER — Other Ambulatory Visit: Payer: Self-pay | Admitting: Orthopedic Surgery

## 2021-03-07 LAB — SARS CORONAVIRUS 2 (TAT 6-24 HRS): SARS Coronavirus 2: NEGATIVE

## 2021-03-08 ENCOUNTER — Encounter (HOSPITAL_COMMUNITY): Payer: Self-pay | Admitting: Orthopedic Surgery

## 2021-03-08 MED ORDER — BUPIVACAINE LIPOSOME 1.3 % IJ SUSP
20.0000 mL | Freq: Once | INTRAMUSCULAR | Status: DC
  Filled 2021-03-08: qty 20

## 2021-03-08 MED ORDER — TRANEXAMIC ACID 1000 MG/10ML IV SOLN
2000.0000 mg | INTRAVENOUS | Status: DC
  Filled 2021-03-08: qty 20

## 2021-03-09 ENCOUNTER — Encounter (HOSPITAL_COMMUNITY): Payer: Self-pay | Admitting: Orthopedic Surgery

## 2021-03-09 ENCOUNTER — Encounter (HOSPITAL_COMMUNITY)
Admission: AD | Disposition: A | Discharge status: Home Health | Payer: Self-pay | Source: Home / Self Care | Attending: Orthopedic Surgery

## 2021-03-09 ENCOUNTER — Inpatient Hospital Stay (HOSPITAL_COMMUNITY)
Admission: AD | Admit: 2021-03-09 | Discharge: 2021-03-13 | DRG: 470 | Disposition: A | Discharge status: Home Health | Payer: Medicare Other | Attending: Orthopedic Surgery | Admitting: Orthopedic Surgery

## 2021-03-09 ENCOUNTER — Ambulatory Visit (HOSPITAL_COMMUNITY): Payer: Medicare Other | Admitting: Emergency Medicine

## 2021-03-09 ENCOUNTER — Other Ambulatory Visit: Payer: Self-pay

## 2021-03-09 DIAGNOSIS — Y92239 Unspecified place in hospital as the place of occurrence of the external cause: Secondary | ICD-10-CM | POA: Diagnosis not present

## 2021-03-09 DIAGNOSIS — K219 Gastro-esophageal reflux disease without esophagitis: Secondary | ICD-10-CM | POA: Diagnosis present

## 2021-03-09 DIAGNOSIS — J449 Chronic obstructive pulmonary disease, unspecified: Secondary | ICD-10-CM | POA: Diagnosis present

## 2021-03-09 DIAGNOSIS — M25762 Osteophyte, left knee: Secondary | ICD-10-CM | POA: Diagnosis present

## 2021-03-09 DIAGNOSIS — F17210 Nicotine dependence, cigarettes, uncomplicated: Secondary | ICD-10-CM | POA: Diagnosis present

## 2021-03-09 DIAGNOSIS — Z880 Allergy status to penicillin: Secondary | ICD-10-CM

## 2021-03-09 DIAGNOSIS — T391X5A Adverse effect of 4-Aminophenol derivatives, initial encounter: Secondary | ICD-10-CM | POA: Diagnosis not present

## 2021-03-09 DIAGNOSIS — Z7952 Long term (current) use of systemic steroids: Secondary | ICD-10-CM

## 2021-03-09 DIAGNOSIS — R112 Nausea with vomiting, unspecified: Secondary | ICD-10-CM | POA: Diagnosis not present

## 2021-03-09 DIAGNOSIS — Z79899 Other long term (current) drug therapy: Secondary | ICD-10-CM

## 2021-03-09 DIAGNOSIS — I10 Essential (primary) hypertension: Secondary | ICD-10-CM | POA: Diagnosis present

## 2021-03-09 DIAGNOSIS — M1712 Unilateral primary osteoarthritis, left knee: Secondary | ICD-10-CM | POA: Diagnosis present

## 2021-03-09 DIAGNOSIS — G8929 Other chronic pain: Secondary | ICD-10-CM | POA: Diagnosis present

## 2021-03-09 DIAGNOSIS — E039 Hypothyroidism, unspecified: Secondary | ICD-10-CM | POA: Diagnosis present

## 2021-03-09 DIAGNOSIS — Z96652 Presence of left artificial knee joint: Secondary | ICD-10-CM

## 2021-03-09 DIAGNOSIS — R400 Somnolence: Secondary | ICD-10-CM | POA: Diagnosis not present

## 2021-03-09 DIAGNOSIS — F32A Depression, unspecified: Secondary | ICD-10-CM | POA: Diagnosis present

## 2021-03-09 DIAGNOSIS — F419 Anxiety disorder, unspecified: Secondary | ICD-10-CM | POA: Diagnosis present

## 2021-03-09 DIAGNOSIS — Z96642 Presence of left artificial hip joint: Secondary | ICD-10-CM | POA: Diagnosis present

## 2021-03-09 DIAGNOSIS — Z87892 Personal history of anaphylaxis: Secondary | ICD-10-CM

## 2021-03-09 DIAGNOSIS — Z791 Long term (current) use of non-steroidal anti-inflammatories (NSAID): Secondary | ICD-10-CM

## 2021-03-09 DIAGNOSIS — Z79890 Hormone replacement therapy (postmenopausal): Secondary | ICD-10-CM

## 2021-03-09 DIAGNOSIS — Z8673 Personal history of transient ischemic attack (TIA), and cerebral infarction without residual deficits: Secondary | ICD-10-CM

## 2021-03-09 HISTORY — PX: TOTAL KNEE ARTHROPLASTY: SHX125

## 2021-03-09 LAB — BASIC METABOLIC PANEL
Anion gap: 7 (ref 5–15)
BUN: 15 mg/dL (ref 8–23)
CO2: 24 mmol/L (ref 22–32)
Calcium: 9 mg/dL (ref 8.9–10.3)
Chloride: 105 mmol/L (ref 98–111)
Creatinine, Ser: 1.18 mg/dL — ABNORMAL HIGH (ref 0.44–1.00)
GFR, Estimated: 49 mL/min — ABNORMAL LOW (ref >60)
Glucose, Bld: 115 mg/dL — ABNORMAL HIGH (ref 70–99)
Potassium: 4.2 mmol/L (ref 3.5–5.1)
Sodium: 136 mmol/L (ref 135–145)

## 2021-03-09 SURGERY — ARTHROPLASTY, KNEE, TOTAL
Anesthesia: Monitor Anesthesia Care | Site: Knee | Laterality: Left

## 2021-03-09 MED ORDER — VANCOMYCIN HCL IN DEXTROSE 1-5 GM/200ML-% IV SOLN
1000.0000 mg | Freq: Two times a day (BID) | INTRAVENOUS | Status: AC
Start: 2021-03-09 — End: 2021-03-09
  Administered 2021-03-09: 1000 mg via INTRAVENOUS
  Filled 2021-03-09: qty 200

## 2021-03-09 MED ORDER — LACTATED RINGERS IV SOLN: INTRAVENOUS | Status: DC

## 2021-03-09 MED ORDER — EPHEDRINE 5 MG/ML INJ
INTRAVENOUS | Status: AC
  Filled 2021-03-09: qty 5

## 2021-03-09 MED ORDER — FENTANYL CITRATE (PF) 100 MCG/2ML IJ SOLN
INTRAMUSCULAR | Status: AC
  Filled 2021-03-09: qty 2

## 2021-03-09 MED ORDER — ACETAMINOPHEN 500 MG PO TABS
1000.0000 mg | ORAL_TABLET | Freq: Once | ORAL | Status: AC
  Administered 2021-03-09: 1000 mg via ORAL
  Filled 2021-03-09: qty 2

## 2021-03-09 MED ORDER — ATORVASTATIN CALCIUM 20 MG PO TABS
20.0000 mg | ORAL_TABLET | Freq: Every day | ORAL | Status: DC
  Administered 2021-03-09 – 2021-03-13 (×5): 20 mg via ORAL
  Filled 2021-03-09 (×5): qty 1

## 2021-03-09 MED ORDER — NORTRIPTYLINE HCL 25 MG PO CAPS
50.0000 mg | ORAL_CAPSULE | Freq: Every day | ORAL | Status: DC
  Administered 2021-03-09 – 2021-03-12 (×4): 50 mg via ORAL
  Filled 2021-03-09 (×4): qty 2

## 2021-03-09 MED ORDER — PHENYLEPHRINE HCL-NACL 20-0.9 MG/250ML-% IV SOLN
INTRAVENOUS | Status: AC
  Filled 2021-03-09: qty 250

## 2021-03-09 MED ORDER — PHENYLEPHRINE HCL (PRESSORS) 10 MG/ML IV SOLN
INTRAVENOUS | Status: AC
  Filled 2021-03-09: qty 2

## 2021-03-09 MED ORDER — OXYCODONE-ACETAMINOPHEN 7.5-325 MG PO TABS
1.0000 | ORAL_TABLET | Freq: Four times a day (QID) | ORAL | Status: DC
  Administered 2021-03-09 – 2021-03-11 (×5): 1 via ORAL
  Filled 2021-03-09 (×6): qty 1

## 2021-03-09 MED ORDER — DOCUSATE SODIUM 100 MG PO CAPS
100.0000 mg | ORAL_CAPSULE | Freq: Two times a day (BID) | ORAL | Status: DC
  Administered 2021-03-09 – 2021-03-13 (×8): 100 mg via ORAL
  Filled 2021-03-09 (×8): qty 1

## 2021-03-09 MED ORDER — METOPROLOL TARTRATE 25 MG PO TABS
25.0000 mg | ORAL_TABLET | Freq: Every day | ORAL | Status: DC
  Administered 2021-03-10 – 2021-03-13 (×4): 25 mg via ORAL
  Filled 2021-03-09 (×4): qty 1

## 2021-03-09 MED ORDER — HYDROMORPHONE HCL 1 MG/ML IJ SOLN
0.5000 mg | INTRAMUSCULAR | Status: DC | PRN
Start: 2021-03-09 — End: 2021-03-11
  Administered 2021-03-09 – 2021-03-10 (×3): 1 mg via INTRAVENOUS
  Filled 2021-03-09 (×3): qty 1

## 2021-03-09 MED ORDER — PHENYLEPHRINE HCL-NACL 20-0.9 MG/250ML-% IV SOLN
INTRAVENOUS | Status: DC | PRN
  Administered 2021-03-09: 40 ug/min via INTRAVENOUS

## 2021-03-09 MED ORDER — METOCLOPRAMIDE HCL 5 MG PO TABS
5.0000 mg | ORAL_TABLET | Freq: Three times a day (TID) | ORAL | Status: DC | PRN
Start: 2021-03-09 — End: 2021-03-13

## 2021-03-09 MED ORDER — VANCOMYCIN HCL IN DEXTROSE 1-5 GM/200ML-% IV SOLN
1000.0000 mg | INTRAVENOUS | Status: AC
  Administered 2021-03-09: 1000 mg via INTRAVENOUS
  Filled 2021-03-09: qty 200

## 2021-03-09 MED ORDER — ALUM & MAG HYDROXIDE-SIMETH 200-200-20 MG/5ML PO SUSP: 30.0000 mL | Freq: Four times a day (QID) | ORAL | Status: DC | PRN

## 2021-03-09 MED ORDER — ONDANSETRON HCL 4 MG/2ML IJ SOLN
INTRAMUSCULAR | Status: AC
  Filled 2021-03-09: qty 2

## 2021-03-09 MED ORDER — ASPIRIN 81 MG PO CHEW
81.0000 mg | CHEWABLE_TABLET | Freq: Two times a day (BID) | ORAL | Status: DC
  Administered 2021-03-10 – 2021-03-13 (×7): 81 mg via ORAL
  Filled 2021-03-09 (×7): qty 1

## 2021-03-09 MED ORDER — PREDNISONE 20 MG PO TABS
20.0000 mg | ORAL_TABLET | Freq: Every day | ORAL | Status: DC
  Administered 2021-03-09 – 2021-03-13 (×5): 20 mg via ORAL
  Filled 2021-03-09 (×5): qty 1

## 2021-03-09 MED ORDER — FENTANYL CITRATE (PF) 100 MCG/2ML IJ SOLN
INTRAMUSCULAR | Status: DC | PRN
  Administered 2021-03-09 (×2): 25 ug via INTRAVENOUS
  Administered 2021-03-09: 50 ug via INTRAVENOUS

## 2021-03-09 MED ORDER — ALBUMIN HUMAN 5 % IV SOLN
INTRAVENOUS | Status: AC
  Filled 2021-03-09: qty 250

## 2021-03-09 MED ORDER — PROPOFOL 10 MG/ML IV BOLUS
INTRAVENOUS | Status: DC | PRN
  Administered 2021-03-09: 10 mg via INTRAVENOUS
  Administered 2021-03-09: 20 mg via INTRAVENOUS
  Administered 2021-03-09 (×2): 10 mg via INTRAVENOUS
  Administered 2021-03-09 (×2): 20 mg via INTRAVENOUS
  Administered 2021-03-09: 10 mg via INTRAVENOUS

## 2021-03-09 MED ORDER — OXYCODONE HCL 5 MG/5ML PO SOLN: 5.0000 mg | Freq: Once | ORAL | Status: DC | PRN

## 2021-03-09 MED ORDER — POVIDONE-IODINE 10 % EX SWAB
2.0000 "application " | Freq: Once | CUTANEOUS | Status: AC
  Administered 2021-03-09: 2 via TOPICAL

## 2021-03-09 MED ORDER — FENTANYL CITRATE PF 50 MCG/ML IJ SOSY: 25.0000 ug | PREFILLED_SYRINGE | INTRAMUSCULAR | Status: DC | PRN

## 2021-03-09 MED ORDER — LIDOCAINE HCL (CARDIAC) PF 100 MG/5ML IV SOSY
PREFILLED_SYRINGE | INTRAVENOUS | Status: DC | PRN
  Administered 2021-03-09: 40 mg via INTRAVENOUS

## 2021-03-09 MED ORDER — PROPOFOL 1000 MG/100ML IV EMUL
INTRAVENOUS | Status: AC
  Filled 2021-03-09: qty 100

## 2021-03-09 MED ORDER — BUPIVACAINE-EPINEPHRINE (PF) 0.25% -1:200000 IJ SOLN
INTRAMUSCULAR | Status: DC | PRN
  Administered 2021-03-09: 30 mL via PERINEURAL

## 2021-03-09 MED ORDER — PHENOL 1.4 % MT LIQD: 1.0000 | OROMUCOSAL | Status: DC | PRN

## 2021-03-09 MED ORDER — ONDANSETRON HCL 4 MG/2ML IJ SOLN: 4.0000 mg | Freq: Once | INTRAMUSCULAR | Status: DC | PRN

## 2021-03-09 MED ORDER — MENTHOL 3 MG MT LOZG: 1.0000 | LOZENGE | OROMUCOSAL | Status: DC | PRN

## 2021-03-09 MED ORDER — BUPIVACAINE HCL (PF) 0.5 % IJ SOLN
INTRAMUSCULAR | Status: DC | PRN
  Administered 2021-03-09: 30 mL

## 2021-03-09 MED ORDER — WATER FOR IRRIGATION, STERILE IR SOLN
Status: DC | PRN
  Administered 2021-03-09: 2000 mL

## 2021-03-09 MED ORDER — ONDANSETRON HCL 4 MG/2ML IJ SOLN
INTRAMUSCULAR | Status: DC | PRN
Start: 2021-03-09 — End: 2021-03-09
  Administered 2021-03-09: 4 mg via INTRAVENOUS

## 2021-03-09 MED ORDER — METOCLOPRAMIDE HCL 5 MG/ML IJ SOLN
5.0000 mg | Freq: Three times a day (TID) | INTRAMUSCULAR | Status: DC | PRN
  Administered 2021-03-10: 10 mg via INTRAVENOUS
  Filled 2021-03-09: qty 2

## 2021-03-09 MED ORDER — TRANEXAMIC ACID-NACL 1000-0.7 MG/100ML-% IV SOLN
1000.0000 mg | INTRAVENOUS | Status: AC
  Administered 2021-03-09: 1000 mg via INTRAVENOUS
  Filled 2021-03-09: qty 100

## 2021-03-09 MED ORDER — PHENYLEPHRINE 40 MCG/ML (10ML) SYRINGE FOR IV PUSH (FOR BLOOD PRESSURE SUPPORT)
PREFILLED_SYRINGE | INTRAVENOUS | Status: AC
  Filled 2021-03-09: qty 10

## 2021-03-09 MED ORDER — BUPIVACAINE IN DEXTROSE 0.75-8.25 % IT SOLN
INTRATHECAL | Status: DC | PRN
  Administered 2021-03-09: 1.6 mL via INTRATHECAL

## 2021-03-09 MED ORDER — PROPOFOL 500 MG/50ML IV EMUL
INTRAVENOUS | Status: DC | PRN
  Administered 2021-03-09: 50 ug/kg/min via INTRAVENOUS

## 2021-03-09 MED ORDER — ASPIRIN EC 81 MG PO TBEC: 81.0000 mg | DELAYED_RELEASE_TABLET | Freq: Two times a day (BID) | ORAL | 0 refills | Status: AC

## 2021-03-09 MED ORDER — PHENYLEPHRINE 40 MCG/ML (10ML) SYRINGE FOR IV PUSH (FOR BLOOD PRESSURE SUPPORT)
PREFILLED_SYRINGE | INTRAVENOUS | Status: DC | PRN
  Administered 2021-03-09: 120 ug via INTRAVENOUS
  Administered 2021-03-09 (×2): 80 ug via INTRAVENOUS
  Administered 2021-03-09 (×2): 120 ug via INTRAVENOUS

## 2021-03-09 MED ORDER — ONDANSETRON HCL 4 MG PO TABS
4.0000 mg | ORAL_TABLET | Freq: Four times a day (QID) | ORAL | Status: DC | PRN
  Administered 2021-03-09 – 2021-03-11 (×3): 4 mg via ORAL
  Filled 2021-03-09 (×3): qty 1

## 2021-03-09 MED ORDER — SODIUM CHLORIDE 0.9 % IV SOLN: INTRAVENOUS | Status: DC

## 2021-03-09 MED ORDER — PANTOPRAZOLE SODIUM 40 MG PO TBEC
80.0000 mg | DELAYED_RELEASE_TABLET | Freq: Every day | ORAL | Status: DC
Start: 2021-03-09 — End: 2021-03-13
  Administered 2021-03-09 – 2021-03-13 (×5): 80 mg via ORAL
  Filled 2021-03-09 (×5): qty 2

## 2021-03-09 MED ORDER — OXYCODONE HCL 5 MG PO TABS: ORAL_TABLET | ORAL | 0 refills | Status: DC

## 2021-03-09 MED ORDER — SORBITOL 70 % SOLN
30.0000 mL | Freq: Every day | Status: DC | PRN
  Filled 2021-03-09: qty 30

## 2021-03-09 MED ORDER — TRANEXAMIC ACID-NACL 1000-0.7 MG/100ML-% IV SOLN
1000.0000 mg | Freq: Once | INTRAVENOUS | Status: AC
  Administered 2021-03-09: 1000 mg via INTRAVENOUS
  Filled 2021-03-09: qty 100

## 2021-03-09 MED ORDER — BUPIVACAINE LIPOSOME 1.3 % IJ SUSP
INTRAMUSCULAR | Status: DC | PRN
  Administered 2021-03-09: 20 mL

## 2021-03-09 MED ORDER — SODIUM CHLORIDE 0.9% FLUSH
INTRAVENOUS | Status: DC | PRN
  Administered 2021-03-09: 50 mL

## 2021-03-09 MED ORDER — BUPIVACAINE-EPINEPHRINE (PF) 0.25% -1:200000 IJ SOLN
INTRAMUSCULAR | Status: AC
  Filled 2021-03-09: qty 30

## 2021-03-09 MED ORDER — FLEET ENEMA 7-19 GM/118ML RE ENEM: 1.0000 | ENEMA | Freq: Once | RECTAL | Status: DC | PRN

## 2021-03-09 MED ORDER — EPHEDRINE SULFATE-NACL 50-0.9 MG/10ML-% IV SOSY
PREFILLED_SYRINGE | INTRAVENOUS | Status: DC | PRN
  Administered 2021-03-09: 10 mg via INTRAVENOUS
  Administered 2021-03-09 (×2): 5 mg via INTRAVENOUS
  Administered 2021-03-09: 10 mg via INTRAVENOUS
  Administered 2021-03-09 (×2): 5 mg via INTRAVENOUS

## 2021-03-09 MED ORDER — OXYCODONE HCL 5 MG PO TABS
5.0000 mg | ORAL_TABLET | Freq: Two times a day (BID) | ORAL | Status: DC | PRN
  Administered 2021-03-09: 5 mg via ORAL
  Filled 2021-03-09: qty 1

## 2021-03-09 MED ORDER — SENNOSIDES-DOCUSATE SODIUM 8.6-50 MG PO TABS: 1.0000 | ORAL_TABLET | Freq: Every evening | ORAL | Status: DC | PRN

## 2021-03-09 MED ORDER — QUETIAPINE FUMARATE 50 MG PO TABS
100.0000 mg | ORAL_TABLET | Freq: Every day | ORAL | Status: DC
  Administered 2021-03-09 – 2021-03-11 (×3): 100 mg via ORAL
  Filled 2021-03-09 (×3): qty 2

## 2021-03-09 MED ORDER — CHLORHEXIDINE GLUCONATE 0.12 % MT SOLN
15.0000 mL | Freq: Once | OROMUCOSAL | Status: AC
  Administered 2021-03-09: 15 mL via OROMUCOSAL

## 2021-03-09 MED ORDER — TRANEXAMIC ACID 1000 MG/10ML IV SOLN
INTRAVENOUS | Status: DC | PRN
  Administered 2021-03-09: 2000 mg via TOPICAL

## 2021-03-09 MED ORDER — LIDOCAINE 2% (20 MG/ML) 5 ML SYRINGE
INTRAMUSCULAR | Status: AC
  Filled 2021-03-09: qty 5

## 2021-03-09 MED ORDER — AMISULPRIDE (ANTIEMETIC) 5 MG/2ML IV SOLN: 10.0000 mg | Freq: Once | INTRAVENOUS | Status: DC | PRN

## 2021-03-09 MED ORDER — ALBUMIN HUMAN 5 % IV SOLN: INTRAVENOUS | Status: DC | PRN

## 2021-03-09 MED ORDER — SODIUM CHLORIDE 0.9 % IR SOLN
Status: DC | PRN
  Administered 2021-03-09: 1000 mL

## 2021-03-09 MED ORDER — OXYCODONE HCL 5 MG PO TABS
5.0000 mg | ORAL_TABLET | Freq: Once | ORAL | Status: DC | PRN
Start: 2021-03-09 — End: 2021-03-09

## 2021-03-09 MED ORDER — ACETAMINOPHEN 325 MG PO TABS
325.0000 mg | ORAL_TABLET | Freq: Four times a day (QID) | ORAL | Status: DC | PRN
  Administered 2021-03-09 – 2021-03-13 (×4): 650 mg via ORAL
  Filled 2021-03-09 (×5): qty 2

## 2021-03-09 MED ORDER — ONDANSETRON HCL 4 MG/2ML IJ SOLN
4.0000 mg | Freq: Four times a day (QID) | INTRAMUSCULAR | Status: DC | PRN
  Administered 2021-03-10: 4 mg via INTRAVENOUS
  Filled 2021-03-09 (×2): qty 2

## 2021-03-09 MED ORDER — LEVOTHYROXINE SODIUM 112 MCG PO TABS
112.0000 ug | ORAL_TABLET | Freq: Every day | ORAL | Status: DC
  Administered 2021-03-10 – 2021-03-13 (×4): 112 ug via ORAL
  Filled 2021-03-09 (×4): qty 1

## 2021-03-09 MED ORDER — GLYCOPYRROLATE 0.2 MG/ML IJ SOLN
INTRAMUSCULAR | Status: DC | PRN
Start: 2021-03-09 — End: 2021-03-09
  Administered 2021-03-09: .2 mg via INTRAVENOUS

## 2021-03-09 SURGICAL SUPPLY — 60 items
ATTUNE PS FEM LT SZ 5 CEM KNEE (Femur) ×2 IMPLANT
ATTUNE PSRP INSR SZ5 5 KNEE (Insert) ×2 IMPLANT
BAG COUNTER SPONGE SURGICOUNT (BAG) ×2 IMPLANT
BAG DECANTER FOR FLEXI CONT (MISCELLANEOUS) ×2 IMPLANT
BAG SPEC THK2 15X12 ZIP CLS (MISCELLANEOUS) ×1
BAG SPNG CNTER NS LX DISP (BAG) ×1
BAG ZIPLOCK 12X15 (MISCELLANEOUS) ×2 IMPLANT
BASEPLATE TIBIAL ROTATING SZ 4 (Knees) ×2 IMPLANT
BLADE SAGITTAL 25.0X1.19X90 (BLADE) ×2 IMPLANT
BLADE SAW SGTL 13X75X1.27 (BLADE) ×2 IMPLANT
BLADE SURG 15 STRL LF DISP TIS (BLADE) ×1 IMPLANT
BLADE SURG 15 STRL SS (BLADE) ×2
BLADE SURG SZ10 CARB STEEL (BLADE) ×4 IMPLANT
BNDG CMPR MED 15X6 ELC VLCR LF (GAUZE/BANDAGES/DRESSINGS) ×1
BNDG ELASTIC 6X15 VLCR STRL LF (GAUZE/BANDAGES/DRESSINGS) ×2 IMPLANT
BOWL SMART MIX CTS (DISPOSABLE) ×2 IMPLANT
BSPLAT TIB 4 CMNT ROT PLAT STR (Knees) ×1 IMPLANT
CEMENT HV SMART SET (Cement) ×4 IMPLANT
CLSR STERI-STRIP ANTIMIC 1/2X4 (GAUZE/BANDAGES/DRESSINGS) ×4 IMPLANT
COVER SURGICAL LIGHT HANDLE (MISCELLANEOUS) ×2 IMPLANT
CUFF TOURN SGL QUICK 34 (TOURNIQUET CUFF) ×2
CUFF TRNQT CYL 34X4.125X (TOURNIQUET CUFF) ×1 IMPLANT
DECANTER SPIKE VIAL GLASS SM (MISCELLANEOUS) ×4 IMPLANT
DRAPE ORTHO SPLIT 77X108 STRL (DRAPES) ×4
DRAPE SURG ORHT 6 SPLT 77X108 (DRAPES) ×2 IMPLANT
DRAPE U-SHAPE 47X51 STRL (DRAPES) ×2 IMPLANT
DRESSING AQUACEL AG SP 3.5X10 (GAUZE/BANDAGES/DRESSINGS) ×1 IMPLANT
DRSG AQUACEL AG SP 3.5X10 (GAUZE/BANDAGES/DRESSINGS) ×2
DURAPREP 26ML APPLICATOR (WOUND CARE) ×4 IMPLANT
ELECT REM PT RETURN 15FT ADLT (MISCELLANEOUS) ×2 IMPLANT
GLOVE SRG 8 PF TXTR STRL LF DI (GLOVE) ×2 IMPLANT
GLOVE SURG ORTHO LTX SZ8 (GLOVE) ×2 IMPLANT
GLOVE SURG POLYISO LF SZ7.5 (GLOVE) ×2 IMPLANT
GLOVE SURG UNDER POLY LF SZ8 (GLOVE) ×4
GOWN STRL REUS W/TWL XL LVL3 (GOWN DISPOSABLE) ×8 IMPLANT
HANDPIECE INTERPULSE COAX TIP (DISPOSABLE) ×2
HOLDER FOLEY CATH W/STRAP (MISCELLANEOUS) IMPLANT
HOOD PEEL AWAY FLYTE STAYCOOL (MISCELLANEOUS) ×2 IMPLANT
IMMOBILIZER KNEE 20 (SOFTGOODS) ×2
IMMOBILIZER KNEE 20 THIGH 36 (SOFTGOODS) ×1 IMPLANT
KIT TURNOVER KIT A (KITS) ×2 IMPLANT
MANIFOLD NEPTUNE II (INSTRUMENTS) ×2 IMPLANT
NEEDLE HYPO 22GX1.5 SAFETY (NEEDLE) ×4 IMPLANT
NS IRRIG 1000ML POUR BTL (IV SOLUTION) ×2 IMPLANT
PACK ICE MAXI GEL EZY WRAP (MISCELLANEOUS) ×2 IMPLANT
PACK TOTAL KNEE CUSTOM (KITS) ×2 IMPLANT
PATELLA MEDIAL ATTUN 35MM KNEE (Knees) ×2 IMPLANT
PIN DRILL FIX HALF THREAD (BIT) ×2 IMPLANT
PIN STEINMAN FIXATION KNEE (PIN) ×2 IMPLANT
PROTECTOR NERVE ULNAR (MISCELLANEOUS) ×2 IMPLANT
SET HNDPC FAN SPRY TIP SCT (DISPOSABLE) ×1 IMPLANT
SUT ETHIBOND NAB CT1 #1 30IN (SUTURE) ×4 IMPLANT
SUT MNCRL AB 3-0 PS2 18 (SUTURE) ×2 IMPLANT
SUT VIC AB 0 CT1 36 (SUTURE) ×2 IMPLANT
SUT VIC AB 2-0 CT1 27 (SUTURE) ×4
SUT VIC AB 2-0 CT1 TAPERPNT 27 (SUTURE) ×2 IMPLANT
SYR CONTROL 10ML LL (SYRINGE) ×6 IMPLANT
TOWEL OR 17X26 10 PK STRL BLUE (TOWEL DISPOSABLE) ×2 IMPLANT
TRAY FOLEY MTR SLVR 16FR STAT (SET/KITS/TRAYS/PACK) ×2 IMPLANT
WATER STERILE IRR 1000ML POUR (IV SOLUTION) ×4 IMPLANT

## 2021-03-09 NOTE — Progress Notes (Signed)
Patient still not here.  Advised front desk to send patient straight back upon arrival as patient is very late. They voiced understanding.

## 2021-03-09 NOTE — Progress Notes (Signed)
Orthopedic Tech Progress Note Patient Details:  Alexa Chavez 21-Jun-1946 [id number]  Patient ID: Alexa Chavez, female   DOB: 11-Sep-1945, 75 y.o.   MRN: [id number]  [name] 03/09/2021, 10:46 AM Pm placed on pt in pacu. Bone foam given to pt in pacu

## 2021-03-09 NOTE — Anesthesia Procedure Notes (Signed)
Spinal  Patient location during procedure: OR Start time: 03/09/2021 7:39 AM End time: 03/09/2021 7:42 AM Reason for block: surgical anesthesia Staffing Performed: resident/CRNA  Anesthesiologist: Mellody Dance, MD Resident/CRNA: Yolonda Kida, CRNA Preanesthetic Checklist Completed: patient identified, IV checked, site marked, risks and benefits discussed, surgical consent, monitors and equipment checked, pre-op evaluation and timeout performed Spinal Block Patient position: sitting Prep: DuraPrep Patient monitoring: blood pressure, continuous pulse ox and heart rate Approach: midline Location: L3-4 Injection technique: single-shot Needle Needle type: Pencan  Needle gauge: 24 G Assessment Sensory level: T6 Events: CSF return

## 2021-03-09 NOTE — Transfer of Care (Signed)
Immediate Anesthesia Transfer of Care Note  Patient: Alexa Chavez  Procedure(s) Performed: TOTAL KNEE ARTHROPLASTY (Left: Knee)  Patient Location: PACU  Anesthesia Type:Spinal  Level of Consciousness: awake, alert , oriented and patient cooperative  Airway & Oxygen Therapy: Patient Spontanous Breathing and Patient connected to face mask oxygen  Post-op Assessment: Report given to RN and Post -op Vital signs reviewed and stable  Post vital signs: Reviewed and stable  Last Vitals:  Vitals Value Taken Time  BP 98/67 03/09/21 0945  Temp    Pulse 59 03/09/21 0946  Resp 19 03/09/21 0948  SpO2 95 % 03/09/21 0946  Vitals shown include unvalidated device data.  Last Pain:  Vitals:   03/09/21 0648  TempSrc:   PainSc: 0-No pain         Complications: No notable events documented.

## 2021-03-09 NOTE — Anesthesia Procedure Notes (Signed)
Anesthesia Regional Block: Adductor canal block   Pre-Anesthetic Checklist: , timeout performed,  Correct Patient, Correct Site, Correct Laterality,  Correct Procedure, Correct Position, site marked,  Risks and benefits discussed,  Surgical consent,  Pre-op evaluation,  At surgeon's request and post-op pain management  Laterality: Left  Prep: chloraprep       Needles:  Injection technique: Single-shot  Needle Type: Echogenic Stimulator Needle          Additional Needles:   Procedures:,,,, ultrasound used (permanent image in chart),,    Narrative:  Start time: 03/09/2021 6:53 AM End time: 03/09/2021 6:58 AM Injection made incrementally with aspirations every 5 mL.  Performed by: Personally  Anesthesiologist: Mellody Dance, MD  Additional Notes: A functioning IV was confirmed and monitors were applied.  Sterile prep and drape, hand hygiene and sterile gloves were used.  Negative aspiration and test dose prior to incremental administration of local anesthetic. The patient tolerated the procedure well.Ultrasound  guidance: relevant anatomy identified, needle position confirmed, local anesthetic spread visualized around nerve(s), vascular puncture avoided.  Image printed for medical record.

## 2021-03-09 NOTE — Progress Notes (Signed)
Orthopedic Tech Progress Note Patient Details:  Alexa Chavez August 14, 1945 [id number]  Patient ID: Alexa Chavez, female   DOB: 04-Jan-1946, 75 y.o.   MRN: [id number]  Alexa Chavez 03/09/2021, 2:50 PM Cpm removed and pt placed in bonefoam

## 2021-03-09 NOTE — Progress Notes (Signed)
Estella,nurse tech tried to reach patient,Unable to reach patient, called patients daughter, as patient was supposed to arrive at 0515 this am, still not here.  Daughter states they are taking care of some things and then they are on the way. Reminded her patient is late and to come right on.

## 2021-03-09 NOTE — Op Note (Signed)
NAME: Alexa Chavez, Alexa Chavez MEDICAL RECORD NO: [id number] ACCOUNT NO: [id number] DATE OF BIRTH: Jul 09, 1946 FACILITY: [facility] LOCATION: [facility] PHYSICIAN: W D. Carloyn Manner., MD  Operative Report   DATE OF PROCEDURE: 03/09/2021  PREOPERATIVE DIAGNOSIS:  Severe osteoarthritis, left knee.  POSTOPERATIVE DIAGNOSIS:  Severe osteoarthritis, left knee.  OPERATION:  Left total knee replacement (Attune cemented knee, size 5 femur, 5 mm size 5 tibial bearing, size 4 tibia with size 35 mm all-poly patella.  SURGEON:  W D. Carloyn Manner., MD  ASSISTANTVincent Peyer, PA  TOURNIQUET TIME:  50 minutes.  DESCRIPTION OF PROCEDURE:  Spinal anesthetic with a block, supine position, exsanguination of the leg, inflation of thigh tourniquet 350 mm.  Straight skin incision was made with a medial parapatellar approach to the knee.  We did a 5-degree 9 mm valgus  cut on the femur.  We then cut about 3 mm below the most diseased medial compartment with the extension gap measured at 5 mm.  Femur was sized to be a size 5, placed the all-in-one cutting block with the anterior, posterior, and chamfer cuts.  Keel hole  was cut for the tibia after sizing it to be a size 4.  PCL was released.  Small osteophytes were removed from the posterior aspect of the knee.  Box cut was cut for the femur.  Trial femur and tibia placed.  Full extension was noted.  Good stability in  flexion, good balancing of the ligaments.  Patella was cut taking 9.5 mm of patella, placing a 35 mm all-poly trial.  Cement was prepared on the back table.  We inserted the components, tibia followed by femur and patella with the trial bearing.  Trial  bearing was removed.  We did set on the 5 mm thick bearing.  Pulsatile lavage was used on the soft tissues and we infiltrated a mixture of Marcaine and Exparel with an addition of topical TXA.  Final bearing was placed and again all parameters deemed to  be acceptable.  Closure of the capsule was carried out with #1  Ethibond, subcutaneous tissues with 2-0 Vicryl, and the skin with Monocryl.  Lightly compressive sterile dressing, knee immobilizer applied. Taken to recovery room in stable condition.   [facility] D: 03/09/2021 9:18:49 am T: 03/09/2021 10:40:00 am  JOB: [id number]/ [id number]

## 2021-03-09 NOTE — Evaluation (Signed)
Physical Therapy Evaluation Patient Details Name: Alexa Chavez MRN: [id number] DOB: August 15, 1945 Today's Date: 03/09/2021   History of Present Illness  Pt s/p L TKR with hx of L THR and CVA (pt states no residual affect).  Clinical Impression  Pt s/p L TKR and presents with decreased L LE strength/ROM and post op pain limiting functional mobility.  Pt should progress to dc home with 24/7 family assist and follow up HHPT.    Follow Up Recommendations Home health PT    Equipment Recommendations  None recommended by PT    Recommendations for Other Services       Precautions / Restrictions Precautions Precautions: Fall;Knee Required Braces or Orthoses: Knee Immobilizer - Left Knee Immobilizer - Left: Discontinue once straight leg raise with < 10 degree lag Restrictions Weight Bearing Restrictions: No Other Position/Activity Restrictions: WBAT      Mobility  Bed Mobility Overal bed mobility: Needs Assistance Bed Mobility: Supine to Sit     Supine to sit: +2 for physical assistance;+2 for safety/equipment;Min assist;Mod assist     General bed mobility comments: INcreased time with cues for sequence and use of R LE to self assist    Transfers Overall transfer level: Needs assistance Equipment used: Rolling walker (2 wheeled) Transfers: Sit to/from [employer] Sit to Stand: Min assist;Mod assist;+2 safety/equipment;From elevated surface Stand pivot transfers: Min assist;Mod assist;+2 safety/equipment;From elevated surface       General transfer comment: cues for LE management and use of UEs to self assist.  Physical assist to bring wt up and fwd adn to balance in initial standing  Ambulation/Gait Ambulation/Gait assistance: Min assist;Mod assist;+2 safety/equipment Gait Distance (Feet): 3 Feet Assistive device: Rolling walker (2 wheeled) Gait Pattern/deviations: Step-to pattern;Decreased step length - right;Decreased step length - left;Shuffle;Trunk  flexed     General Gait Details: Increased time with cues for posture, sequence and position from RW.  Stairs            Wheelchair Mobility    Modified Rankin (Stroke Patients Only)       Balance Overall balance assessment: Needs assistance Sitting-balance support: Feet supported;No upper extremity supported Sitting balance-Leahy Scale: Fair     Standing balance support: Bilateral upper extremity supported Standing balance-Leahy Scale: Poor                               Pertinent Vitals/Pain Pain Assessment: 0-10 Pain Score: 8  Pain Location: L knee Pain Descriptors / Indicators: Sore;Grimacing;Guarding;Aching Pain Intervention(s): Limited activity within patient's tolerance;Monitored during session;Premedicated before session;Ice applied    Home Living Family/patient expects to be discharged to:: Private residence Living Arrangements: Alone Available Help at Discharge: Family;Available 24 hours/day Type of Home: House Home Access: Stairs to enter Entrance Stairs-Rails: Right Entrance Stairs-Number of Steps: 3 Home Layout: One level Home Equipment: Walker - 2 wheels;Bedside commode      Prior Function Level of Independence: Independent               Hand Dominance        Extremity/Trunk Assessment   Upper Extremity Assessment Upper Extremity Assessment: Overall WFL for tasks assessed    Lower Extremity Assessment Lower Extremity Assessment: LLE deficits/detail LLE: Unable to fully assess due to pain    Cervical / Trunk Assessment Cervical / Trunk Assessment: Normal  Communication   Communication: No difficulties  Cognition Arousal/Alertness: Awake/alert Behavior During Therapy: WFL for tasks assessed/performed Overall Cognitive Status: Within  Functional Limits for tasks assessed                                        General Comments      Exercises     Assessment/Plan    PT Assessment Patient needs  continued PT services  PT Problem List Decreased strength;Decreased range of motion;Decreased activity tolerance;Decreased balance;Decreased mobility;Decreased knowledge of use of DME;Pain       PT Treatment Interventions DME instruction;Gait training;Stair training;Functional mobility training;Therapeutic activities;Therapeutic exercise;Patient/family education    PT Goals (Current goals can be found in the Care Plan section)  Acute Rehab PT Goals Patient Stated Goal: Regain IND PT Goal Formulation: With patient Time For Goal Achievement: 03/15/21 Potential to Achieve Goals: Good    Frequency 7X/week   Barriers to discharge        Co-evaluation               AM-PAC PT "6 Clicks" Mobility  Outcome Measure Help needed turning from your back to your side while in a flat bed without using bedrails?: A Lot Help needed moving from lying on your back to sitting on the side of a flat bed without using bedrails?: A Lot Help needed moving to and from a bed to a chair (including a wheelchair)?: A Lot Help needed standing up from a chair using your arms (e.g., wheelchair or bedside chair)?: A Lot Help needed to walk in hospital room?: A Lot Help needed climbing 3-5 steps with a railing? : A Lot 6 Click Score: 12    End of Session Equipment Utilized During Treatment: Gait belt;Left knee immobilizer Activity Tolerance: Patient limited by fatigue;Patient limited by pain Patient left: in chair;with call bell/phone within reach;with chair alarm set Nurse Communication: Mobility status PT Visit Diagnosis: Difficulty in walking, not elsewhere classified (R26.2);Pain Pain - Right/Left: Left Pain - part of body: Knee    Time: 8250-0370 PT Time Calculation (min) (ACUTE ONLY): 21 min   Charges:   PT Evaluation $PT Eval Low Complexity: 1 Low          Mauro Kaufmann PT Acute Rehabilitation Services Pager [phone] Office [phone]   Orvan Papadakis 03/09/2021, 5:02  PM

## 2021-03-09 NOTE — Interval H&P Note (Signed)
History and Physical Interval Note:  03/09/2021 7:33 AM  Alexa Chavez  has presented today for surgery, with the diagnosis of OA LEFT KNEE.  The various methods of treatment have been discussed with the patient and family. After consideration of risks, benefits and other options for treatment, the patient has consented to  Procedure(s): TOTAL KNEE ARTHROPLASTY (Left) as a surgical intervention.  The patient's history has been reviewed, patient examined, no change in status, stable for surgery.  I have reviewed the patient's chart and labs.  Questions were answered to the patient's satisfaction.     Thera Flake

## 2021-03-10 MED ORDER — SODIUM CHLORIDE 0.9 % IV BOLUS
500.0000 mL | Freq: Once | INTRAVENOUS | Status: AC
  Administered 2021-03-10: 500 mL via INTRAVENOUS

## 2021-03-10 NOTE — TOC Transition Note (Signed)
Transition of Care [facility]) - CM/SW Discharge Note  Patient Details  Name: Alexa Chavez MRN: [id number] Date of Birth: [birth year]  [facility] CM/SW Contact:  Ewing Schlein, LCSW Phone Number: 03/10/2021, 9:56 AM  Clinical Narrative: Patient is expected to discharge home after working with PT. CSW spoke with patient's daughter, Loyal Gambler, to confirm discharge plan. Patient has been prearranged with HHPT through [facility]. [employer] delivered a rolling walker, CPM, and 3N1 to the patient's home prior to surgery so there are no DME at this time. TOC signing off.  Final next level of care: Home w Home Health Services Barriers to Discharge: No Barriers Identified  Patient Goals and CMS Choice Patient states their goals for this hospitalization and ongoing recovery are:: Discharge home with HHPT CMS Medicare.gov Compare Post Acute Care list provided to:: Patient Choice offered to / list presented to : Patient, Adult Children  Discharge Plan and Services        DME Arranged: N/A DME Agency: NA HH Arranged: PT HH Agency: [facility] Representative spoke with at [facility] Agency: Prearranged in orthopedist's office  Readmission Risk Interventions No flowsheet data found.

## 2021-03-10 NOTE — Progress Notes (Signed)
Physical Therapy Treatment Patient Details Name: Alexa Chavez MRN: [id number] DOB: 08-Oct-1945 Today's Date: 03/10/2021    History of Present Illness Pt s/p L TKR with hx of L THR and CVA (pt states no residual affect).    PT Comments    POD # 1 am session Pt slept most of morning.  IV and nasal cannula still on.  Pt on 3 lts sats 91%.  Easily aroused with breakfast tray untouched and it was past 12:20 pm.  General Comments: AxO x 3 very sweet lady but not feeling well today.  Max c/o nausea with a ltlle vomiting.  Assisted to EOB.  General bed mobility comments: increased time and Max Assist to transition to EOB with support L LE.  Pt sat EOB an extended amount of time due to nausea and a lillte vomiting. General transfer comment: pt required MAX Asisst to transfer from elevated bed to recliner and complete 1/4 turn with MAX c/o nausea.  Poor forward flex posture.  RN called to room. Positioned in recliner to comfort.  Offered Ginger Ale and saltines while RN gave IV meds.    Follow Up Recommendations  Home health PT     Equipment Recommendations  None recommended by PT    Recommendations for Other Services       Precautions / Restrictions Precautions Precautions: Fall;Knee Precaution Comments: instructed no pillow under knee Restrictions Weight Bearing Restrictions: No LLE Weight Bearing: Weight bearing as tolerated    Mobility  Bed Mobility Overal bed mobility: Needs Assistance Bed Mobility: Supine to Sit     Supine to sit: Max assist     General bed mobility comments: increased time and Max Assist to transition to EOB with support L LE.  Pt sat EOB an extended amount of time due to nausea and a lillte vomiting.    Transfers Overall transfer level: Needs assistance Equipment used: Rolling walker (2 wheeled) Transfers: Sit to/from [employer] Sit to Stand: Max assist Stand pivot transfers: Max assist       General transfer comment: pt required  MAX Asisst to transfer from elevated bed to recliner and complete 1/4 turn with MAX c/o nausea.  Poor forward flex posture.  RN called to room.  Ambulation/Gait             General Gait Details: transfer only due to nausea.   Stairs             Wheelchair Mobility    Modified Rankin (Stroke Patients Only)       Balance                                            Cognition Arousal/Alertness: Awake/alert Behavior During Therapy: WFL for tasks assessed/performed Overall Cognitive Status: Within Functional Limits for tasks assessed                                 General Comments: AxO x 3 very sweet lady but not feeling well today.  Max c/o nausea with a ltlle vomiting.  Pt was unable to eat breakfast.      Exercises      General Comments        Pertinent Vitals/Pain Pain Assessment: Faces Faces Pain Scale: Hurts little more Pain Location: L knee Pain Descriptors / Indicators: Sore;Grimacing;Guarding;Aching;Operative  site guarding Pain Intervention(s): Monitored during session;Premedicated before session;Repositioned;Ice applied    Home Living                      Prior Function            PT Goals (current goals can now be found in the care plan section) Progress towards PT goals: Progressing toward goals    Frequency    7X/week      PT Plan Current plan remains appropriate    Co-evaluation              AM-PAC PT "6 Clicks" Mobility   Outcome Measure  Help needed turning from your back to your side while in a flat bed without using bedrails?: A Lot Help needed moving from lying on your back to sitting on the side of a flat bed without using bedrails?: A Lot Help needed moving to and from a bed to a chair (including a wheelchair)?: A Lot Help needed standing up from a chair using your arms (e.g., wheelchair or bedside chair)?: A Lot Help needed to walk in hospital room?: A Lot Help needed  climbing 3-5 steps with a railing? : A Lot 6 Click Score: 12    End of Session Equipment Utilized During Treatment: Gait belt Activity Tolerance: Other (comment) (Nausea/vomiting) Patient left: in chair;with call bell/phone within reach;with chair alarm set Nurse Communication: Mobility status PT Visit Diagnosis: Difficulty in walking, not elsewhere classified (R26.2);Pain Pain - Right/Left: Left Pain - part of body: Knee     Time: 1235-1300 PT Time Calculation (min) (ACUTE ONLY): 25 min  Charges:  $Therapeutic Activity: 23-37 mins                     {Alica Shellhammer  PTA Acute  Rehabilitation Services Pager      [phone] Office      [phone]

## 2021-03-10 NOTE — Plan of Care (Signed)
  Problem: Coping: Goal: Level of anxiety will decrease Outcome: Progressing   Problem: Pain Managment: Goal: General experience of comfort will improve Outcome: Progressing   Problem: Safety: Goal: Ability to remain free from injury will improve Outcome: Progressing   

## 2021-03-10 NOTE — Progress Notes (Signed)
Orthopaedic Trauma Progress Note  SUBJECTIVE: No pain in the left leg currently.  Resting comfortably in bed. No other complaints.   OBJECTIVE:  Vitals:   03/10/21 0602 03/10/21 0758  BP: 94/78 94/73  Pulse: 78 75  Resp: 16 20  Temp:  98.3 F (36.8 C)  SpO2: (!) 89% 97%    General: Resting in bed comfortably, no acute distress Respiratory: No increased work of breathing.  Left lower extremity: Dressing over knee is clean, dry, intact.  Endorses sensation throughout extremity.  Compartments soft and compressible.  Neurovascularly intact.    LABS: No results found for this or any previous visit (from the past 24 hour(s)).  ASSESSMENT: Alexa Chavez is a 75 y.o. female, 1 Day Post-Op TOTAL KNEE ARTHROPLASTY  PLAN: Weightbearing: WBAT LLE Incisional and dressing care: Dressings left intact until follow-up  Showering: Okay to shower with assistance Orthopedic device(s):  CPM   Pain management: Continue current regimen VTE prophylaxis: Aspirin, SCDs Dispo: Discharge today after therapies.   Follow - up plan:  Follow up with Dr. Madelon Lips after discharge  Contact information:  Truitt Merle MD, Ulyses Southward PA-C today 03/10/2021. After hours and holidays please check [email or web address] for group call information for [facility]   Cybele Maule A. Michaelyn Barter, PA-C [phone] (office) [email or web address]

## 2021-03-10 NOTE — Progress Notes (Signed)
PHYSICAL THERAPY  Pt still feeling "really poorly" and unable to tolerate PM session. Sleepy/groggy/nausea.   Pt has NOT met goals to D/C to home today.  Will be seen tomorrow am.  Rica Koyanagi  PTA Acute  Rehabilitation Services Pager      [phone] Office      [phone]

## 2021-03-11 DIAGNOSIS — Z96652 Presence of left artificial knee joint: Secondary | ICD-10-CM

## 2021-03-11 DIAGNOSIS — M1712 Unilateral primary osteoarthritis, left knee: Secondary | ICD-10-CM | POA: Diagnosis present

## 2021-03-11 DIAGNOSIS — G8929 Other chronic pain: Secondary | ICD-10-CM | POA: Diagnosis present

## 2021-03-11 DIAGNOSIS — Z79890 Hormone replacement therapy (postmenopausal): Secondary | ICD-10-CM | POA: Diagnosis not present

## 2021-03-11 DIAGNOSIS — Z791 Long term (current) use of non-steroidal anti-inflammatories (NSAID): Secondary | ICD-10-CM | POA: Diagnosis not present

## 2021-03-11 DIAGNOSIS — F17210 Nicotine dependence, cigarettes, uncomplicated: Secondary | ICD-10-CM | POA: Diagnosis present

## 2021-03-11 DIAGNOSIS — J449 Chronic obstructive pulmonary disease, unspecified: Secondary | ICD-10-CM | POA: Diagnosis present

## 2021-03-11 DIAGNOSIS — R112 Nausea with vomiting, unspecified: Secondary | ICD-10-CM | POA: Diagnosis not present

## 2021-03-11 DIAGNOSIS — M25762 Osteophyte, left knee: Secondary | ICD-10-CM | POA: Diagnosis present

## 2021-03-11 DIAGNOSIS — Z79899 Other long term (current) drug therapy: Secondary | ICD-10-CM | POA: Diagnosis not present

## 2021-03-11 DIAGNOSIS — E039 Hypothyroidism, unspecified: Secondary | ICD-10-CM | POA: Diagnosis present

## 2021-03-11 DIAGNOSIS — Z87892 Personal history of anaphylaxis: Secondary | ICD-10-CM | POA: Diagnosis not present

## 2021-03-11 DIAGNOSIS — Z96642 Presence of left artificial hip joint: Secondary | ICD-10-CM | POA: Diagnosis present

## 2021-03-11 DIAGNOSIS — Y92239 Unspecified place in hospital as the place of occurrence of the external cause: Secondary | ICD-10-CM | POA: Diagnosis not present

## 2021-03-11 DIAGNOSIS — R400 Somnolence: Secondary | ICD-10-CM | POA: Diagnosis not present

## 2021-03-11 DIAGNOSIS — Z7952 Long term (current) use of systemic steroids: Secondary | ICD-10-CM | POA: Diagnosis not present

## 2021-03-11 DIAGNOSIS — F419 Anxiety disorder, unspecified: Secondary | ICD-10-CM | POA: Diagnosis present

## 2021-03-11 DIAGNOSIS — T391X5A Adverse effect of 4-Aminophenol derivatives, initial encounter: Secondary | ICD-10-CM | POA: Diagnosis not present

## 2021-03-11 DIAGNOSIS — Z880 Allergy status to penicillin: Secondary | ICD-10-CM | POA: Diagnosis not present

## 2021-03-11 DIAGNOSIS — K219 Gastro-esophageal reflux disease without esophagitis: Secondary | ICD-10-CM | POA: Diagnosis present

## 2021-03-11 DIAGNOSIS — F32A Depression, unspecified: Secondary | ICD-10-CM | POA: Diagnosis present

## 2021-03-11 DIAGNOSIS — Z8673 Personal history of transient ischemic attack (TIA), and cerebral infarction without residual deficits: Secondary | ICD-10-CM | POA: Diagnosis not present

## 2021-03-11 DIAGNOSIS — I10 Essential (primary) hypertension: Secondary | ICD-10-CM | POA: Diagnosis present

## 2021-03-11 MED ORDER — HYDROCODONE-ACETAMINOPHEN 7.5-325 MG PO TABS: 1.0000 | ORAL_TABLET | Freq: Four times a day (QID) | ORAL | Status: DC | PRN

## 2021-03-11 MED ORDER — OXYCODONE-ACETAMINOPHEN 5-325 MG PO TABS
1.0000 | ORAL_TABLET | Freq: Four times a day (QID) | ORAL | Status: DC | PRN
  Administered 2021-03-11: 1 via ORAL
  Filled 2021-03-11 (×2): qty 1

## 2021-03-11 NOTE — Plan of Care (Signed)
  Problem: Pain Managment: Goal: General experience of comfort will improve Outcome: Progressing   

## 2021-03-11 NOTE — Progress Notes (Signed)
Subjective: Patient incredibly somnolent. Barely able to respond to my questions. Recently given Percocet. She did report pain as mild right now. Whole tray of different types of food in front of her that looked mostly untouched so unsure that she is eating and drinking properly. Not urinating at all. RN did bladder scan and will have to in & out cath patient. No CP, SOB.  Having a lot of difficulty working with PT/OT on mobilization OOB. Not listening to cues well. Sessions limited by pain, N/V, dizziness, weakness.   Objective:   VITALS:   Vitals:   03/10/21 2209 03/11/21 0558 03/11/21 0800 03/11/21 0847  BP: 110/73 106/70 (!) 130/99 120/87  Pulse: 94 86 (!) 130 90  Resp: 17 16    Temp: 98.8 F (37.1 C) 97.7 F (36.5 C)    TempSrc: Oral Oral    SpO2: 95% 95%    Weight:      Height:       CBC Latest Ref Rng & Units 02/26/2021 07/03/2012 07/02/2012  WBC 4.0 - 10.5 K/uL 5.3 18.3(H) 15.7(H)  Hemoglobin 12.0 - 15.0 g/dL 78.2 95.6 21.3  Hematocrit 36.0 - 46.0 % 40.7 39.3 38.2  Platelets 150 - 400 K/uL 237 269 256   BMP Latest Ref Rng & Units 03/09/2021 02/26/2021 07/03/2012  Glucose 70 - 99 mg/dL 086(V) 784(O) 962(X)  BUN 8 - 23 mg/dL 15 15 23   Creatinine 0.44 - 1.00 mg/dL ) 5.28(U) 1.32(G  Sodium 135 - 145 mmol/L 136 140 137  Potassium 3.5 - 5.1 mmol/L 4.2 2.9(L) 3.7  Chloride 98 - 111 mmol/L 105 105 102  CO2 22 - 32 mmol/L 24 21(L) 17(L)  Calcium 8.9 - 10.3 mg/dL 9.0 4.01) 8.7   Intake/Output      08/27 0701 08/28 0700 08/28 0701 08/29 0700   P.O. 350    I.V. (mL/kg) 1121 (17.2)    IV Piggyback     Total Intake(mL/kg) 1471 (22.5)    Urine (mL/kg/hr)     Blood     Total Output     Net +1471            Physical Exam: General: NAD. Very somnolent. Barely able to respond to questions or open eyes Resp: No increased wob Cardio: regular rate and rhythm ABD soft Neurologically intact MSK Difficult to ascertain but appears NVI Dorsiflexion/Plantar flexion  intact Incision: dressing C/D/I KI in place LLE   Assessment: 2 Days Post-Op  S/P Procedure(s) (LRB): TOTAL KNEE ARTHROPLASTY (Left) by Dr. 9/29 on 03/09/21  Active Problems:   Primary localized osteoarthritis of left knee   Plan: I think Percocet 7.5mg  qid may be too strong for her and causing her somnolence as well as dizziness, weakness, N/V. Going to bump her down to 5mg  qid. Will also d/c Dilaudid and Oxy PRN and switch to Vicodin PRN  Need to push eating and drinking  Advance diet Up with therapy Incentive Spirometry Elevate and Apply ice  Weightbearing: WBAT LLE Insicional and dressing care: Dressings left intact until follow-up and Reinforce dressings as needed Orthopedic device(s): KI Showering: Keep dressing dry VTE prophylaxis: Aspirin 81mg  BID    , SCDs, ambulation Pain control: Percocet 5mg  qid; Tylenol PRN, Norco PRN Follow - up plan:  in the office with Dr. 03/11/21 information for today:  MD, PA-C  Dispo: TBD. Home hopefully tomorrow if can mobilize more, become more coherent, and better control weakness, N/V, dizziness. Needs to also void on  own.    Marzetta Board Office [phone] 03/11/2021, 10:57 AM

## 2021-03-11 NOTE — Progress Notes (Signed)
Physical Therapy Treatment Patient Details Name: Alexa Chavez MRN: [id number] DOB: October 22, 1945 Today's Date: 03/11/2021    History of Present Illness Pt s/p L TKR with hx of L THR and CVA (pt states no residual affect).    PT Comments    Pt cooperative but impulsive and continues to require significant assist and cueing for safe performance of basic mobility tasks.  This am, pt assisted from bed to bedside commode for toileting and then ambulated limited distance to chair.  Pt limited by pain/fatigue and c/o dizziness - BP 120/87 - RN aware.   Follow Up Recommendations  Home health PT     Equipment Recommendations  None recommended by PT    Recommendations for Other Services       Precautions / Restrictions Precautions Precautions: Fall;Knee Precaution Comments: instructed no pillow under knee Required Braces or Orthoses: Knee Immobilizer - Left Knee Immobilizer - Left: Discontinue once straight leg raise with < 10 degree lag Restrictions Weight Bearing Restrictions: No LLE Weight Bearing: Weight bearing as tolerated    Mobility  Bed Mobility Overal bed mobility: Needs Assistance Bed Mobility: Supine to Sit     Supine to sit: Max assist     General bed mobility comments: increased time and Max Assist to transition to EOB with support L LE.    Transfers Overall transfer level: Needs assistance Equipment used: Rolling walker (2 wheeled) Transfers: Sit to/from [employer] Sit to Stand: Max assist Stand pivot transfers: Max assist       General transfer comment: pt required MAX Asisst to transfer from elevated bed to [facility] and complete 1/4 turn.  Cues for posture, position from RW, safe positioning for transitions.  Ambulation/Gait Ambulation/Gait assistance: Min assist;Mod assist;+2 safety/equipment Gait Distance (Feet): 4 Feet Assistive device: Rolling walker (2 wheeled) Gait Pattern/deviations: Step-to pattern;Decreased step length -  right;Decreased step length - left;Shuffle;Trunk flexed     General Gait Details: INcreased time with cues for sequence, posture, position from [employer]             Wheelchair Mobility    Modified Rankin (Stroke Patients Only)       Balance Overall balance assessment: Needs assistance Sitting-balance support: Feet supported;No upper extremity supported Sitting balance-Leahy Scale: Fair     Standing balance support: Bilateral upper extremity supported Standing balance-Leahy Scale: Poor                              Cognition Arousal/Alertness: Awake/alert Behavior During Therapy: WFL for tasks assessed/performed Overall Cognitive Status: Within Functional Limits for tasks assessed                                        Exercises      General Comments        Pertinent Vitals/Pain Pain Assessment: 0-10 Pain Score: 8  Pain Location: L knee Pain Descriptors / Indicators: Sore;Grimacing;Guarding;Aching;Operative site guarding Pain Intervention(s): Limited activity within patient's tolerance;Monitored during session    Home Living                      Prior Function            PT Goals (current goals can now be found in the care plan section) Acute Rehab PT Goals Patient Stated Goal: Less pain PT Goal Formulation:  With patient Time For Goal Achievement: 03/15/21 Potential to Achieve Goals: Good Progress towards PT goals: Progressing toward goals    Frequency    7X/week      PT Plan Current plan remains appropriate    Co-evaluation              AM-PAC PT "6 Clicks" Mobility   Outcome Measure  Help needed turning from your back to your side while in a flat bed without using bedrails?: A Lot Help needed moving from lying on your back to sitting on the side of a flat bed without using bedrails?: A Lot Help needed moving to and from a bed to a chair (including a wheelchair)?: A Lot Help needed  standing up from a chair using your arms (e.g., wheelchair or bedside chair)?: A Lot Help needed to walk in hospital room?: A Lot Help needed climbing 3-5 steps with a railing? : Total 6 Click Score: 11    End of Session Equipment Utilized During Treatment: Gait belt Activity Tolerance: Patient limited by pain;Patient limited by fatigue Patient left: in chair;with call bell/phone within reach;with chair alarm set;with nursing/sitter in room Nurse Communication: Mobility status PT Visit Diagnosis: Difficulty in walking, not elsewhere classified (R26.2);Pain Pain - Right/Left: Left Pain - part of body: Knee     Time: 0817-0850 PT Time Calculation (min) (ACUTE ONLY): 33 min  Charges:  $Gait Training: 8-22 mins $Therapeutic Activity: 8-22 mins                     Mauro Kaufmann PT Acute Rehabilitation Services Pager [phone] Office [phone]    Adewale Pucillo 03/11/2021, 9:28 AM

## 2021-03-11 NOTE — Progress Notes (Signed)
Physical Therapy Treatment Patient Details Name: Alexa Chavez MRN: [id number] DOB: 1945/10/12 Today's Date: 03/11/2021    History of Present Illness Pt s/p L TKR with hx of L THR and CVA (pt states no residual affect).    PT Comments    Pt continues lethargic but more alert than earlier this pm and able to rouse sufficiently to follow cues and perform therex program with assist.  Pt with eyes closed most of session but able to participate with increased cueing.  Pt progress very slow and dc plan may need to be amended to follow up at SNF level prior to return home.   Follow Up Recommendations  Home health PT     Equipment Recommendations  None recommended by PT    Recommendations for Other Services       Precautions / Restrictions Precautions Precautions: Fall;Knee Precaution Comments: instructed no pillow under knee Required Braces or Orthoses: Knee Immobilizer - Left Knee Immobilizer - Left: Discontinue once straight leg raise with < 10 degree lag Restrictions Weight Bearing Restrictions: No LLE Weight Bearing: Weight bearing as tolerated    Mobility  Bed Mobility                    Transfers                    Ambulation/Gait                 Stairs             Wheelchair Mobility    Modified Rankin (Stroke Patients Only)       Balance                                            Cognition Arousal/Alertness: Lethargic;Suspect due to medications (but rousable) Behavior During Therapy: Flat affect Overall Cognitive Status: Within Functional Limits for tasks assessed                                 General Comments: Pt requring cues to increase level of alertness and follow through with tasks.  Pt keeps eyes closed majority of session      Exercises Total Joint Exercises Ankle Circles/Pumps: AAROM;15 reps;Supine;Both Quad Sets: AAROM;15 reps;Supine;Both Heel Slides: AAROM;Left;15  reps;Supine Straight Leg Raises: AAROM;Left;15 reps;Supine    General Comments        Pertinent Vitals/Pain Pain Assessment: Faces Faces Pain Scale: Hurts even more Pain Location: L knee Pain Descriptors / Indicators: Sore;Grimacing;Aching Pain Intervention(s): Limited activity within patient's tolerance;Monitored during session;Ice applied    Home Living                      Prior Function            PT Goals (current goals can now be found in the care plan section) Acute Rehab PT Goals Patient Stated Goal: Less pain PT Goal Formulation: With patient Time For Goal Achievement: 03/15/21 Potential to Achieve Goals: Good Progress towards PT goals: Not progressing toward goals - comment (lethargy, pain)    Frequency    7X/week      PT Plan Current plan remains appropriate    Co-evaluation              AM-PAC PT "6 Clicks" Mobility  Outcome Measure  Help needed turning from your back to your side while in a flat bed without using bedrails?: A Lot Help needed moving from lying on your back to sitting on the side of a flat bed without using bedrails?: A Lot Help needed moving to and from a bed to a chair (including a wheelchair)?: A Lot Help needed standing up from a chair using your arms (e.g., wheelchair or bedside chair)?: A Lot Help needed to walk in hospital room?: A Lot Help needed climbing 3-5 steps with a railing? : Total 6 Click Score: 11    End of Session   Activity Tolerance: Patient limited by fatigue;Patient limited by lethargy;Patient limited by pain Patient left: in bed;with call bell/phone within reach;with bed alarm set Nurse Communication: Mobility status PT Visit Diagnosis: Difficulty in walking, not elsewhere classified (R26.2);Pain Pain - Right/Left: Left Pain - part of body: Knee     Time: 5993-5701 PT Time Calculation (min) (ACUTE ONLY): 20 min  Charges:  $Therapeutic Exercise: 8-22 mins                     Mauro Kaufmann PT Acute Rehabilitation Services Pager [phone] Office [phone]    Joeli Fenner 03/11/2021, 3:32 PM

## 2021-03-11 NOTE — Plan of Care (Signed)
  Problem: Education: Goal: Knowledge of General Education information will improve Description: Including pain rating scale, medication(s)/side effects and non-pharmacologic comfort measures Outcome: Progressing   Problem: Activity: Goal: Risk for activity intolerance will decrease Outcome: Progressing   Problem: Elimination: Goal: Will not experience complications related to bowel motility Outcome: Progressing   

## 2021-03-12 ENCOUNTER — Encounter (HOSPITAL_COMMUNITY): Payer: Self-pay | Admitting: Orthopedic Surgery

## 2021-03-12 LAB — COMPREHENSIVE METABOLIC PANEL
ALT: 20 U/L (ref 0–44)
AST: 45 U/L — ABNORMAL HIGH (ref 15–41)
Albumin: 3.3 g/dL — ABNORMAL LOW (ref 3.5–5.0)
Alkaline Phosphatase: 42 U/L (ref 38–126)
Anion gap: 6 (ref 5–15)
BUN: 22 mg/dL (ref 8–23)
CO2: 24 mmol/L (ref 22–32)
Calcium: 7.9 mg/dL — ABNORMAL LOW (ref 8.9–10.3)
Chloride: 108 mmol/L (ref 98–111)
Creatinine, Ser: 1.42 mg/dL — ABNORMAL HIGH (ref 0.44–1.00)
GFR, Estimated: 39 mL/min — ABNORMAL LOW (ref >60)
Glucose, Bld: 110 mg/dL — ABNORMAL HIGH (ref 70–99)
Potassium: 4.1 mmol/L (ref 3.5–5.1)
Sodium: 138 mmol/L (ref 135–145)
Total Bilirubin: 1.1 mg/dL (ref 0.3–1.2)
Total Protein: 7.2 g/dL (ref 6.5–8.1)

## 2021-03-12 LAB — CBC WITH DIFFERENTIAL/PLATELET
Abs Immature Granulocytes: 0.03 10*3/uL (ref 0.00–0.07)
Basophils Absolute: 0 10*3/uL (ref 0.0–0.1)
Basophils Relative: 0 %
Eosinophils Absolute: 0 10*3/uL (ref 0.0–0.5)
Eosinophils Relative: 0 %
HCT: 29.9 % — ABNORMAL LOW (ref 36.0–46.0)
Hemoglobin: 10.1 g/dL — ABNORMAL LOW (ref 12.0–15.0)
Immature Granulocytes: 1 %
Lymphocytes Relative: 8 %
Lymphs Abs: 0.5 10*3/uL — ABNORMAL LOW (ref 0.7–4.0)
MCH: 35.7 pg — ABNORMAL HIGH (ref 26.0–34.0)
MCHC: 33.8 g/dL (ref 30.0–36.0)
MCV: 105.7 fL — ABNORMAL HIGH (ref 80.0–100.0)
Monocytes Absolute: 0.5 10*3/uL (ref 0.1–1.0)
Monocytes Relative: 8 %
Neutro Abs: 5.3 10*3/uL (ref 1.7–7.7)
Neutrophils Relative %: 83 %
Platelets: 175 10*3/uL (ref 150–400)
RBC: 2.83 MIL/uL — ABNORMAL LOW (ref 3.87–5.11)
RDW: 13.2 % (ref 11.5–15.5)
WBC: 6.4 10*3/uL (ref 4.0–10.5)
nRBC: 0 % (ref 0.0–0.2)

## 2021-03-12 NOTE — Progress Notes (Addendum)
Physical Therapy Treatment Patient Details Name: Alexa Chavez MRN: [id number] DOB: May 10, 1946 Today's Date: 03/12/2021    History of Present Illness Pt s/p L TKR with hx of L THR and CVA (pt states no residual affect).    PT Comments    POD # 3 am session Pt not progressing as well as expected.  Assisted OOB to [facility] required much effort effort.  General bed mobility comments: increased time and Max Assist to transition to EOB with support L LE.   Great difficulty self scooting to EOB and feeling very weak. General transfer comment: pt required MAX Asisst to transfer from elevated bed to [facility] and complete 1/4 turn.  Cues for posture, position from RW, safe positioning for transitions.  X 1 posterior LOB getting off BSC with poor balance and 50% VC's tp avoid pulling self up on walker.  Pt unable to maintain standing balance and perform self peri care. General Gait Details: very limited amb distance due to max c/o weakness after using BSC.  + 2 assist needed for safety such that recliner was following. Pt lives home alone.  Will consult LPT pt's slow progress.  Pt may need St Rehab at SNF vs retun to home.   Follow Up Recommendations  SNF     Equipment Recommendations       Recommendations for Other Services       Precautions / Restrictions Precautions Precautions: Fall;Knee Precaution Comments: instructed no pillow under knee Restrictions Weight Bearing Restrictions: No Other Position/Activity Restrictions: WBAT    Mobility  Bed Mobility Overal bed mobility: Needs Assistance Bed Mobility: Supine to Sit     Supine to sit: Max assist     General bed mobility comments: increased time and Max Assist to transition to EOB with support L LE.   Great difficulty self scooting to EOB and feeling very weak.    Transfers Overall transfer level: Needs assistance Equipment used: Rolling walker (2 wheeled) Transfers: Sit to/from [employer] Sit to Stand: Max  assist Stand pivot transfers: Max assist       General transfer comment: pt required MAX Asisst to transfer from elevated bed to [facility] and complete 1/4 turn.  Cues for posture, position from RW, safe positioning for transitions.  X 1 posterior LOB getting off BSC with poor balance and 50% VC's tp avoid pulling self up on walker.  Pt unable to maintain standing balance and perform self peri care.  Ambulation/Gait Ambulation/Gait assistance: Max assist;+2 physical assistance;+2 safety/equipment Gait Distance (Feet): 2 Feet Assistive device: Rolling walker (2 wheeled) Gait Pattern/deviations: Step-to pattern;Decreased step length - right;Decreased step length - left;Shuffle;Trunk flexed Gait velocity: decreased   General Gait Details: very limited amb distance due to max c/o weakness after using BSC.  + 2 assist needed for safety such that recliner was following.   Stairs             Wheelchair Mobility    Modified Rankin (Stroke Patients Only)       Balance                                            Cognition Arousal/Alertness: Awake/alert Behavior During Therapy: WFL for tasks assessed/performed Overall Cognitive Status: Within Functional Limits for tasks assessed  General Comments: AxO x 3 feeling "very weak".  No longer nausea but still feeling "bad".      Exercises      General Comments        Pertinent Vitals/Pain Pain Assessment: Faces Faces Pain Scale: Hurts little more Pain Location: L knee Pain Descriptors / Indicators: Sore;Grimacing;Aching Pain Intervention(s): Monitored during session;Repositioned    Home Living                      Prior Function            PT Goals (current goals can now be found in the care plan section) Progress towards PT goals: Progressing toward goals    Frequency    7X/week      PT Plan Current plan remains appropriate     Co-evaluation              AM-PAC PT "6 Clicks" Mobility   Outcome Measure  Help needed turning from your back to your side while in a flat bed without using bedrails?: A Lot Help needed moving from lying on your back to sitting on the side of a flat bed without using bedrails?: A Lot Help needed moving to and from a bed to a chair (including a wheelchair)?: A Lot Help needed standing up from a chair using your arms (e.g., wheelchair or bedside chair)?: A Lot Help needed to walk in hospital room?: A Lot Help needed climbing 3-5 steps with a railing? : Total 6 Click Score: 11    End of Session Equipment Utilized During Treatment: Gait belt Activity Tolerance: Patient limited by fatigue;Patient limited by pain Patient left: in chair;with call bell/phone within reach;with chair alarm set Nurse Communication: Mobility status PT Visit Diagnosis: Difficulty in walking, not elsewhere classified (R26.2);Pain Pain - Right/Left: Left Pain - part of body: Knee     Time: 8938-1017 PT Time Calculation (min) (ACUTE ONLY): 29 min  Charges:  $Gait Training: 8-22 mins $Therapeutic Activity: 8-22 mins                     {Jearl Soto  PTA Acute  Rehabilitation Services Pager      [phone] Office      [phone]

## 2021-03-12 NOTE — Progress Notes (Signed)
Orthopedic Tech Progress Note Patient Details:  Alexa Chavez 1945/12/09 [id number]  CPM Left Knee CPM Left Knee: On Left Knee Flexion (Degrees): 60 Left Knee Extension (Degrees): 0  Post Interventions Patient Tolerated: Well Instructions Provided: Care of device  [name] 03/12/2021, 2:02 PM

## 2021-03-12 NOTE — Progress Notes (Addendum)
Patient a yellow mews, respirations slightly elevated at 24, now with audible expiratory wheezes, also slightly increased work of breathing noted, maintaining o2 saturation of 98% on room air, ortho on-call Clydene Pugh, [place]) paged with request for possible breathing treatment and also mews notification from prior yellow mews due to incorrect on-call being notified, awaiting new orders, will continue to monitor the patient, she is sleeping currently.

## 2021-03-12 NOTE — Progress Notes (Signed)
Subjective: 3 Days Post-Op Procedure(s) (LRB): TOTAL KNEE ARTHROPLASTY (Left) Patient reports pain as moderate.  Family not currently at bedside, pt does talk and answer questions/follow ques, seems to be some baseline confusion unclear if related to hospital stay vs early dementia??   Objective: Vital signs in last 24 hours: Temp:  [98 F (36.7 C)-100 F (37.8 C)] 99.2 F (37.3 C) (08/29 1321) Pulse Rate:  [82-133] 91 (08/29 1321) Resp:  [17-24] 20 (08/29 1321) BP: (97-130)/(70-98) 124/89 (08/29 1321) SpO2:  [93 %-100 %] 99 % (08/29 1321) Weight:  [65.3 kg] 65.3 kg (08/29 0300)  Intake/Output from previous day: 08/28 0701 - 08/29 0700 In: 420 [P.O.:120; I.V.:300] Out: 1700 [Urine:1700] Intake/Output this shift: Total I/O In: -  Out: 300 [Urine:300]  No results for input(s): HGB in the last 72 hours. No results for input(s): WBC, RBC, HCT, PLT in the last 72 hours. No results for input(s): NA, K, CL, CO2, BUN, CREATININE, GLUCOSE, CALCIUM in the last 72 hours. No results for input(s): LABPT, INR in the last 72 hours.  Neurovascular intact Sensation intact distally Intact pulses distally Dorsiflexion/Plantar flexion intact Incision: dressing C/D/I   Assessment/Plan: 3 Days Post-Op Procedure(s) (LRB): TOTAL KNEE ARTHROPLASTY (Left) Up with therapy WBAT  CPM 6 hrs daily when in bed, boan foam use as well Pain control as currently ordered Curtains open to prevent confusion  SW consult pt slow to progress and per PT recs SNF placement    Anticipated LOS equal to or greater than 2 midnights due to - Age 19 and older with one or more of the following:  - Obesity  - Expected need for hospital services (PT, OT, Nursing) required for safe  discharge  - Anticipated need for postoperative skilled nursing care or inpatient rehab  - Active co-morbidities: Chronic pain requiring opiods, Stroke, and Respiratory Failure/COPD OR   - Unanticipated findings during/Post Surgery:  Slow post-op progression: GI, pain control, mobility  - Patient is a high risk of re-admission due to: Barriers to post-acute care (logistical, no family support in home)   Margart Sickles 03/12/2021, 1:37 PM

## 2021-03-12 NOTE — Plan of Care (Signed)
  Problem: Pain Managment: Goal: General experience of comfort will improve Outcome: Progressing   Problem: Coping: Goal: Level of anxiety will decrease Outcome: Progressing   

## 2021-03-12 NOTE — Progress Notes (Signed)
PHYSICAL THERAPY  Pt back in bed in CPM rest comfortably.  Plans are now for SNF.  Will see her in am.  Felecia Shelling  PTA Acute  Rehabilitation Services Pager      [phone] Office      [phone]

## 2021-03-12 NOTE — Anesthesia Postprocedure Evaluation (Signed)
Anesthesia Post Note  Patient: Alexa Chavez  Procedure(s) Performed: TOTAL KNEE ARTHROPLASTY (Left: Knee)     Patient location during evaluation: PACU Anesthesia Type: MAC, Regional and Spinal Level of consciousness: oriented and awake and alert Pain management: pain level controlled Vital Signs Assessment: post-procedure vital signs reviewed and stable Respiratory status: spontaneous breathing and respiratory function stable Cardiovascular status: blood pressure returned to baseline and stable Postop Assessment: no headache, no backache, no apparent nausea or vomiting and able to ambulate Anesthetic complications: no   No notable events documented.  Last Vitals:  Vitals:   03/12/21 0430 03/12/21 0642  BP: 100/73 114/71  Pulse: (!) 102 (!) 103  Resp: 20 20  Temp: 36.8 C 36.8 C  SpO2: 97% 99%    Last Pain:  Vitals:   03/12/21 0642  TempSrc: Oral  PainSc:    Pain Goal: Patients Stated Pain Goal: 2 (03/11/21 1341)                 Merlinda Frederick

## 2021-03-12 NOTE — Plan of Care (Signed)
?  Problem: Activity: ?Goal: Risk for activity intolerance will decrease ?Outcome: Progressing ?  ?Problem: Safety: ?Goal: Ability to remain free from injury will improve ?Outcome: Progressing ?  ?Problem: Pain Managment: ?Goal: General experience of comfort will improve ?Outcome: Progressing ?  ?

## 2021-03-12 NOTE — Progress Notes (Signed)
Patient triggered a yellow mews score due to elevated temp and heart rate, patient had just ambulated fro the chair to the bed with some mobility difficulty and was also having some pain, thermostat was also set to 78 degrees, patient denied any chest pain or shortness of breath, lungs were clear bilaterally, heat was turned down, tylenol was given, incentive spirometer was encouraged, and on-call provider was notified, no new orders at this time, will continue to monitor and check vitals per mews protocol.

## 2021-03-12 NOTE — Progress Notes (Signed)
Orthopedic Tech Progress Note Patient Details:  Alexa Chavez 05-07-1946 [id number]  CPM Left Knee CPM Left Knee: Off Left Knee Flexion (Degrees): 90 Left Knee Extension (Degrees): 0  Post Interventions Patient Tolerated: Unable to use device properly Instructions Provided: Adjustment of device, Care of device  [name] 03/12/2021, 11:31 AMCPM pickup

## 2021-03-13 MED ORDER — OXYCODONE-ACETAMINOPHEN 7.5-325 MG PO TABS: 1.0000 | ORAL_TABLET | ORAL | 0 refills | Status: AC | PRN

## 2021-03-13 NOTE — Discharge Instructions (Signed)

## 2021-03-13 NOTE — Progress Notes (Signed)
Subjective: 4 Days Post-Op Procedure(s) (LRB): TOTAL KNEE ARTHROPLASTY (Left)   Objective: Vital signs in last 24 hours: Temp:  [99.1 F (37.3 C)-99.5 F (37.5 C)] 99.1 F (37.3 C) (08/30 0641) Pulse Rate:  [91-102] 102 (08/30 0641) Resp:  [16-20] 16 (08/30 0641) BP: (124-140)/(85-95) 130/85 (08/30 0641) SpO2:  [96 %-99 %] 98 % (08/30 0641)  Intake/Output from previous day: 08/29 0701 - 08/30 0700 In: 719.3 [P.O.:250; I.V.:469.3] Out: 1850 [Urine:1850] Intake/Output this shift: No intake/output data recorded.  Recent Labs    03/12/21 1349  HGB 10.1*   Recent Labs    03/12/21 1349  WBC 6.4  RBC 2.83*  HCT 29.9*  PLT 175   Recent Labs    03/12/21 1349  NA 138  K 4.1  CL 108  CO2 24  BUN 22  CREATININE 1.42*  GLUCOSE 110*  CALCIUM 7.9*   No results for input(s): LABPT, INR in the last 72 hours.   Assessment/Plan: 4 Days Post-Op Procedure(s) (LRB): TOTAL KNEE ARTHROPLASTY (Left) Up with therapy Discharge home with home health   Long discussion with daughter last evening, states mother is not taking her seroquel at home can give her decreased mentation med was d/c last evening.  Doing better today with this also improved greatly with PT will plan on d/c home today with HHPT, will also try to add HHOT/CNA to make sure pt is getting adequate mobility and needs covered at home.    Anticipated LOS equal to or greater than 2 midnights due to - Age 66 and older with one or more of the following:  - Obesity  - Expected need for hospital services (PT, OT, Nursing) required for safe  discharge  - Anticipated need for postoperative skilled nursing care or inpatient rehab  - Active co-morbidities: Chronic pain requiring opiods, Stroke, and Respiratory Failure/COPD OR   - Unanticipated findings during/Post Surgery: Slow post-op progression: GI, pain control, mobility  - Patient is a high risk of re-admission due to: None   Margart Sickles 03/13/2021, 10:54  AM

## 2021-03-13 NOTE — Progress Notes (Signed)
PHYSICAL THERAPY  Per pt, daughter declines SNF rec and plans to stay with pt at pt's home 24/7.  Pt will need HH PT.  Has a walker.  Felecia Shelling  PTA Acute  Rehabilitation Services Pager      [phone] Office      [phone]

## 2021-03-13 NOTE — Progress Notes (Signed)
Discharge package printed and instructions given to daughter. Verbalizes understanding.  

## 2021-03-13 NOTE — Plan of Care (Signed)
  Problem: Activity: Goal: Risk for activity intolerance will decrease Outcome: Progressing   Problem: Pain Managment: Goal: General experience of comfort will improve Outcome: Progressing   Problem: Safety: Goal: Ability to remain free from injury will improve Outcome: Progressing   

## 2021-03-13 NOTE — Progress Notes (Signed)
Physical Therapy Treatment Patient Details Name: Alexa Chavez MRN: [id number] DOB: [birth year] Today's Date: 03/13/2021    History of Present Illness Pt s/p L TKR with hx of L THR and CVA (pt states no residual affect).    PT Comments    POD # 4 pt feeling much better. General bed mobility comments: increased time with use of rail and more able to self move B LE to EOB with increased time to complete scooting.  NO c/o nausea this session. General transfer comment: pt present with increased self ability to rise from elevated bed with only 25% VC's on proper hand placement and safety with turns.  Pt slow but steady. General Gait Details: increased amb distance and ability this session with no c/o nausea.  Pain increased from 4/10 at rest to 8/10 with activity.  Reported to RN. Will see pt again this afternoon.    Follow Up Recommendations  Home health PT (pt states dght declines SNF)     Equipment Recommendations       Recommendations for Other Services       Precautions / Restrictions Precautions Precautions: Fall;Knee Precaution Comments: instructed no pillow under knee Restrictions Weight Bearing Restrictions: No LLE Weight Bearing: Weight bearing as tolerated Other Position/Activity Restrictions: WBAT    Mobility  Bed Mobility Overal bed mobility: Needs Assistance Bed Mobility: Supine to Sit     Supine to sit: Min assist     General bed mobility comments: increased time with use of rail and more able to self move B LE to EOB with increased time to complete scooting.  NO c/o nausea this session.    Transfers Overall transfer level: Needs assistance Equipment used: Rolling walker (2 wheeled) Transfers: Sit to/from [employer] Sit to Stand: Min assist Stand pivot transfers: Min assist       General transfer comment: pt present with increased self ability to rise from elevated bed with only 25% VC's on proper hand placement and safety with  turns.  Pt slow but steady.  Ambulation/Gait Ambulation/Gait assistance: Min assist Gait Distance (Feet): 75 Feet Assistive device: Rolling walker (2 wheeled) Gait Pattern/deviations: Step-to pattern;Decreased step length - right;Decreased step length - left;Shuffle;Trunk flexed Gait velocity: decreased   General Gait Details: increased amb distance and ability this session with no c/o nausea.  Pain increased from 4/10 at rest to 8/10 with activity.  Reported to RN.   Stairs             Wheelchair Mobility    Modified Rankin (Stroke Patients Only)       Balance                                            Cognition Arousal/Alertness: Awake/alert Behavior During Therapy: WFL for tasks assessed/performed Overall Cognitive Status: Within Functional Limits for tasks assessed                                 General Comments: AxO X 3 feeling "much better" sstted her daughter does NOT want her to go to a Rehab and can stay with pt at pt's home 24/7 as she works from home      Exercises   10 reps AP, knee presses and towel squeezes followed by ICE   General Comments  Pertinent Vitals/Pain Pain Assessment: 0-10 Pain Score: 8  Pain Location: L knee Pain Descriptors / Indicators: Sore;Grimacing;Aching Pain Intervention(s): Monitored during session;Repositioned;Ice applied    Home Living                      Prior Function            PT Goals (current goals can now be found in the care plan section) Progress towards PT goals: Progressing toward goals    Frequency    7X/week      PT Plan Current plan remains appropriate    Co-evaluation              AM-PAC PT "6 Clicks" Mobility   Outcome Measure  Help needed turning from your back to your side while in a flat bed without using bedrails?: A Little Help needed moving from lying on your back to sitting on the side of a flat bed without using bedrails?:  A Little Help needed moving to and from a bed to a chair (including a wheelchair)?: A Little Help needed standing up from a chair using your arms (e.g., wheelchair or bedside chair)?: A Little Help needed to walk in hospital room?: A Little Help needed climbing 3-5 steps with a railing? : A Lot 6 Click Score: 17    End of Session Equipment Utilized During Treatment: Gait belt Activity Tolerance: Patient limited by pain Patient left: in chair;with call bell/phone within reach;with chair alarm set Nurse Communication: Mobility status;Patient requests pain meds PT Visit Diagnosis: Difficulty in walking, not elsewhere classified (R26.2);Pain Pain - Right/Left: Left Pain - part of body: Knee     Time: 2952-8413 PT Time Calculation (min) (ACUTE ONLY): 24 min  Charges:  $Gait Training: 8-22 mins $Therapeutic Activity: 8-22 mins                    Felecia Shelling  PTA Acute  Rehabilitation Services Pager      [phone] Office      [phone]

## 2021-03-13 NOTE — Discharge Summary (Signed)
PATIENT ID: Alexa Chavez        MRN:  [id number]          DOB/AGE: Nov 24, 1945 / 75 y.o.    DISCHARGE SUMMARY  ADMISSION DATE:    03/09/2021 DISCHARGE DATE:   03/13/2021   ADMISSION DIAGNOSIS: Primary localized osteoarthritis of left knee [M17.12] S/P total knee arthroplasty, left [Z96.652]    DISCHARGE DIAGNOSIS:  OA LEFT KNEE    ADDITIONAL DIAGNOSIS: Active Problems:   Primary localized osteoarthritis of left knee   S/P total knee arthroplasty, left  Past Medical History:  Diagnosis Date   Anxiety    Arthritis    "bad in my knees" (07/02/2012)   Depression    GERD (gastroesophageal reflux disease)    HTN (hypertension) 07/02/2012   Hypertension    Hypothyroidism 07/02/2012   Pneumonia 07/02/2012   community acquired pneumonia (07/02/2012)   Stroke (HCC) ~ 2009   denies residual (07/02/2012)    PROCEDURE: Procedure(s): TOTAL KNEE ARTHROPLASTY Left on 03/09/2021  CONSULTS: PT    HISTORY:  See H&P in chart  HOSPITAL COURSE:  Alexa Chavez is a 75 y.o. admitted on 03/09/2021 and found to have a diagnosis of OA LEFT KNEE.  After appropriate laboratory studies were obtained  they were taken to the operating room on 03/09/2021 and underwent  Procedure(s): TOTAL KNEE ARTHROPLASTY  Left.   They were given perioperative antibiotics:  Anti-infectives (From admission, onward)    Start     Dose/Rate Route Frequency Ordered Stop   03/09/21 1800  vancomycin (VANCOCIN) IVPB 1000 mg/200 mL premix        1,000 mg 200 mL/hr over 60 Minutes Intravenous Every 12 hours 03/09/21 1240 03/09/21 1910   03/09/21 0630  vancomycin (VANCOCIN) IVPB 1000 mg/200 mL premix        1,000 mg 200 mL/hr over 60 Minutes Intravenous On call to O.R. 03/09/21 3875 03/09/21 0736     .  Tolerated the procedure well.    POD #1, altered mental status, slow to progress with PT, made some med changes   IV saline locked.  O2 on occasion.  POD #2, altered mental status, slow to progress with  PT  POD#3, still slow to progress with PT, slight improvement in mental status. After speaking with daughter additional med changes made.  POD#4, improved mental status and progress with PT  The remainder of the hospital course was dedicated to ambulation and strengthening.   The patient was discharged on 4 Days Post-Op in  Stable condition.  Blood products given:none  DIAGNOSTIC STUDIES: Recent vital signs: Patient Vitals for the past 24 hrs:  BP Temp Temp src Pulse Resp SpO2  03/13/21 0641 130/85 99.1 F (37.3 C) Oral (!) 102 16 98 %  03/12/21 2036 (!) 140/95 99.5 F (37.5 C) Oral 95 18 96 %  03/12/21 1321 124/89 99.2 F (37.3 C) Oral 91 20 99 %       Recent laboratory studies: Recent Labs    03/12/21 1349  WBC 6.4  HGB 10.1*  HCT 29.9*  PLT 175   Recent Labs    03/09/21 0635 03/12/21 1349  NA 136 138  K 4.2 4.1  CL 105 108  CO2 24 24  BUN 15 22  CREATININE 1.18* 1.42*  GLUCOSE 115* 110*  CALCIUM 9.0 7.9*   Lab Results  Component Value Date   INR 1.1 02/26/2021   INR 1.58 (H) 05/06/2009   INR 1.44 05/05/2009     Recent  Radiographic Studies :  No results found.  DISCHARGE INSTRUCTIONS:   DISCHARGE MEDICATIONS:   Allergies as of 03/13/2021       Reactions   Penicillins Anaphylaxis   "my throat started closing up" (07/02/2012)        Medication List     STOP taking these medications    famotidine 20 MG tablet Commonly known as: Pepcid   guaiFENesin 600 MG 12 hr tablet Commonly known as: MUCINEX   levofloxacin 750 MG tablet Commonly known as: Levaquin   naproxen 500 MG tablet Commonly known as: NAPROSYN   nicotine 7 mg/24hr patch Commonly known as: NICODERM CQ - dosed in mg/24 hr   QUEtiapine 100 MG tablet Commonly known as: SEROQUEL       TAKE these medications    aspirin EC 81 MG tablet Take 1 tablet (81 mg total) by mouth 2 (two) times daily. TO PREVENT BLOOD CLOTS   atorvastatin 20 MG tablet Commonly known as:  LIPITOR Take 20 mg by mouth daily.   levothyroxine 112 MCG tablet Commonly known as: SYNTHROID Take 1 tablet (112 mcg total) by mouth daily before breakfast.   metoprolol tartrate 25 MG tablet Commonly known as: LOPRESSOR Take 25 mg by mouth daily.   nortriptyline 50 MG capsule Commonly known as: PAMELOR Take 50 mg by mouth at bedtime.   omeprazole 40 MG capsule Commonly known as: PRILOSEC Take 40 mg by mouth daily.   oxyCODONE-acetaminophen 7.5-325 MG tablet Commonly known as: PERCOCET Take 1 tablet by mouth every 4 (four) hours as needed for severe pain. What changed:  when to take this reasons to take this   predniSONE 20 MG tablet Commonly known as: DELTASONE Take 20 mg by mouth daily.               Durable Medical Equipment  (From admission, onward)           Start     Ordered   03/09/21 1241  DME Walker rolling  Once       Question Answer Comment  Walker: With 5 Inch Wheels   Patient needs a walker to treat with the following condition Primary localized osteoarthritis of left knee      03/09/21 1240            FOLLOW UP VISIT:    Follow-up Information     Frederico Hamman, MD. Schedule an appointment as soon as possible for a visit.   Specialty: Orthopedic Surgery Contact information: [address] [place] [address] [phone]         [facility] Follow up.   Specialty: Home Health Services Why: PT Contact information: [address] [place] [address] [phone]                 DISPOSITION:   Home  CONDITION:  Stable   Margart Sickles, PA-C  03/13/2021 11:05 AM

## 2021-03-13 NOTE — Progress Notes (Signed)
Physical Therapy Treatment Patient Details Name: Alexa Chavez MRN: [id number] DOB: [birth year] Today's Date: 03/13/2021    History of Present Illness Pt s/p L TKR with hx of L THR and CVA (pt states no residual affect).    PT Comments    POD #4 pm session Pt feels ready to go home so we practiced stairs as she has 3 steps outside her apartment which she states has B rails.  General stair comments: 50% VC's on proper sequencing and safety.  Performed well and safe enough for D/C to home with daughter.  Then returned to room to perform some TE's following HEP handout.  Instructed on proper tech, freq as well as use of ICE.   Addressed all mobility questions, discussed appropriate activity, educated on use of ICE.  Pt ready for D/C to home.   Follow Up Recommendations  Home health PT     Equipment Recommendations  None recommended by PT    Recommendations for Other Services       Precautions / Restrictions Precautions Precautions: Fall;Knee Precaution Comments: instructed no pillow under knee Restrictions Weight Bearing Restrictions: No LLE Weight Bearing: Weight bearing as tolerated    Mobility  Bed Mobility Overal bed mobility: Needs Assistance Bed Mobility: Supine to Sit     Supine to sit: Min guard;Supervision     General bed mobility comments: increased self ability to transition to EOB with increased time.    Transfers Overall transfer level: Needs assistance   Transfers: Sit to/from Stand;Stand Pivot Transfers Sit to Stand: Supervision Stand pivot transfers: Supervision;Min guard       General transfer comment: pt self able to rise with < 25% VC's on proper hand placement.  Slow but steady.  Ambulation/Gait Ambulation/Gait assistance: Supervision;Min guard Gait Distance (Feet): 45 Feet Assistive device: Rolling walker (2 wheeled) Gait Pattern/deviations: Step-to pattern;Decreased step length - right;Decreased step length - left;Shuffle;Trunk  flexed Gait velocity: decreased   General Gait Details: decreased amb distance due to stair training but tolerated well.  Requires increased time.  Slow but steady.  Pain is "tolerable".   Stairs Stairs: Yes Stairs assistance: Supervision;Min guard Stair Management: Step to pattern;Two rails Number of Stairs: 6 General stair comments: 50% VC's on proper sequencing and safety.  Performed well and safe enough for D/C to home with daughter.   Wheelchair Mobility    Modified Rankin (Stroke Patients Only)       Balance                                            Cognition Arousal/Alertness: Awake/alert Behavior During Therapy: WFL for tasks assessed/performed Overall Cognitive Status: Within Functional Limits for tasks assessed                                 General Comments: AxO X 3 feeling "much better" sstted her daughter does NOT want her to go to a Rehab and can stay with pt at pt's home 24/7 as she works from home      Exercises  Total Knee Replacement TE's following HEP handout 10 reps B LE ankle pumps 05 reps towel squeezes 05 reps knee presses 05 reps heel slides  05 reps SAQ's 05 reps SLR's 05 reps ABD Educated on use of gait belt to assist with TE's Followed by  ICE     General Comments        Pertinent Vitals/Pain Pain Assessment: 0-10 Pain Score: 5  Pain Location: L knee Pain Descriptors / Indicators: Sore;Grimacing;Aching Pain Intervention(s): Monitored during session;Premedicated before session;Repositioned;Ice applied    Home Living                      Prior Function            PT Goals (current goals can now be found in the care plan section) Progress towards PT goals: Progressing toward goals    Frequency    7X/week      PT Plan Current plan remains appropriate    Co-evaluation              AM-PAC PT "6 Clicks" Mobility   Outcome Measure  Help needed turning from your back to  your side while in a flat bed without using bedrails?: A Little Help needed moving from lying on your back to sitting on the side of a flat bed without using bedrails?: A Little Help needed moving to and from a bed to a chair (including a wheelchair)?: A Little Help needed standing up from a chair using your arms (e.g., wheelchair or bedside chair)?: A Little Help needed to walk in hospital room?: A Little Help needed climbing 3-5 steps with a railing? : A Little 6 Click Score: 18    End of Session Equipment Utilized During Treatment: Gait belt Activity Tolerance: Patient tolerated treatment well Patient left: in chair;with call bell/phone within reach;with chair alarm set Nurse Communication: Mobility status (pt ready for D/C to home) PT Visit Diagnosis: Difficulty in walking, not elsewhere classified (R26.2);Pain Pain - Right/Left: Left Pain - part of body: Knee     Time: 1430-1457 PT Time Calculation (min) (ACUTE ONLY): 27 min  Charges:  $Gait Training: 8-22 mins $Therapeutic Exercise: 8-22 mins                     {Trampas Stettner  PTA Acute  Rehabilitation Services Pager      [phone] Office      [phone]

## 2021-03-13 NOTE — Progress Notes (Signed)
Orthopedic Tech Progress Note Patient Details:  Alexa Chavez May 10, 1946 [id number]  Patient ID: Alexa Chavez, female   DOB: 02/08/1946, 75 y.o.   MRN: [id number]  [name] 03/13/2021, 2:50 PMPickup CPM

## 2021-03-15 ENCOUNTER — Other Ambulatory Visit: Payer: Self-pay

## 2021-03-15 ENCOUNTER — Emergency Department (HOSPITAL_BASED_OUTPATIENT_CLINIC_OR_DEPARTMENT_OTHER): Payer: Medicare Other

## 2021-03-15 ENCOUNTER — Emergency Department (HOSPITAL_BASED_OUTPATIENT_CLINIC_OR_DEPARTMENT_OTHER)
Admission: EM | Admit: 2021-03-15 | Discharge: 2021-03-15 | Disposition: A | Discharge status: Home / Self Care | Payer: Medicare Other | Attending: Emergency Medicine | Admitting: Emergency Medicine

## 2021-03-15 ENCOUNTER — Encounter (HOSPITAL_BASED_OUTPATIENT_CLINIC_OR_DEPARTMENT_OTHER): Payer: Self-pay | Admitting: *Deleted

## 2021-03-15 DIAGNOSIS — I10 Essential (primary) hypertension: Secondary | ICD-10-CM | POA: Diagnosis not present

## 2021-03-15 DIAGNOSIS — E039 Hypothyroidism, unspecified: Secondary | ICD-10-CM | POA: Insufficient documentation

## 2021-03-15 DIAGNOSIS — Z96652 Presence of left artificial knee joint: Secondary | ICD-10-CM | POA: Insufficient documentation

## 2021-03-15 DIAGNOSIS — F17210 Nicotine dependence, cigarettes, uncomplicated: Secondary | ICD-10-CM | POA: Insufficient documentation

## 2021-03-15 DIAGNOSIS — Z7982 Long term (current) use of aspirin: Secondary | ICD-10-CM | POA: Insufficient documentation

## 2021-03-15 DIAGNOSIS — Z79899 Other long term (current) drug therapy: Secondary | ICD-10-CM | POA: Insufficient documentation

## 2021-03-15 DIAGNOSIS — Z96642 Presence of left artificial hip joint: Secondary | ICD-10-CM | POA: Insufficient documentation

## 2021-03-15 DIAGNOSIS — M79605 Pain in left leg: Secondary | ICD-10-CM | POA: Diagnosis present

## 2021-03-15 DIAGNOSIS — J441 Chronic obstructive pulmonary disease with (acute) exacerbation: Secondary | ICD-10-CM | POA: Insufficient documentation

## 2021-03-15 NOTE — ED Triage Notes (Signed)
She had surgery on her left knee a week ago. Dressing to her incision noted. Swelling, redness and hot to touch her knee. Home health nurse told her she has a DVT.

## 2021-03-15 NOTE — ED Provider Notes (Signed)
[facility] EMERGENCY DEPARTMENT Provider Note   CSN: [id number] Arrival date & time: 03/15/21  1814     History Chief Complaint  Patient presents with   Leg Pain    Alexa Chavez is a 75 y.o. female presenting for evaluation of left leg pain and swelling.  Patient states she had surgery on her left knee a week ago.  Discharged 2 days ago.  Her physical therapist today was concerned about her swelling of her leg, going to come to the ER to rule out a DVT.  Patient states her swelling has been present for the past 1 to 2 days.  She denies fevers, chills, significant worsening pain.  No pain or swelling of the right leg.  No chest pain or shortness of breath.  I reviewed recent hospitalization/surgery.  Patient had some issues with postop confusion, was discharged 2 days ago with improve mental status and progress with PT.  HPI     Past Medical History:  Diagnosis Date   Anxiety    Arthritis    "bad in my knees" (07/02/2012)   Depression    GERD (gastroesophageal reflux disease)    HTN (hypertension) 07/02/2012   Hypertension    Hypothyroidism 07/02/2012   Pneumonia 07/02/2012   community acquired pneumonia (07/02/2012)   Stroke (HCC) ~ 2009   denies residual (07/02/2012)    Patient Active Problem List   Diagnosis Date Noted   S/P total knee arthroplasty, left 03/11/2021   Primary localized osteoarthritis of left knee 03/09/2021   Hypothyroid 07/03/2012   Smoker 07/03/2012   Acute respiratory failure (HCC) 07/02/2012   COPD exacerbation (HCC) 07/02/2012   Streptococcal pneumonia (HCC) 07/02/2012   HTN (hypertension) 07/02/2012   Hypothyroidism 07/02/2012   Arthritis 07/02/2012    Past Surgical History:  Procedure Laterality Date   TOTAL HIP ARTHROPLASTY  ~ 2011   "left" (07/02/2012)   TOTAL KNEE ARTHROPLASTY Left 03/09/2021   Procedure: TOTAL KNEE ARTHROPLASTY;  Surgeon: Frederico Hamman, MD;  Location: [facility];  Service: Orthopedics;  Laterality: Left;      OB History   No obstetric history on file.     No family history on file.  Social History   Tobacco Use   Smoking status: Every Day    Packs/day: 0.12    Years: 46.00    Pack years: 5.52    Types: Cigarettes   Smokeless tobacco: Never  Vaping Use   Vaping Use: Never used  Substance Use Topics   Alcohol use: Yes    Comment: 07/02/2012 "gin & coke; on occasions (birthdays, etc); not reguarly"    Drug use: No    Home Medications Prior to Admission medications   Medication Sig Start Date End Date Taking? Authorizing Provider  aspirin EC 81 MG tablet Take 1 tablet (81 mg total) by mouth 2 (two) times daily. TO PREVENT BLOOD CLOTS 03/09/21 04/08/21 Yes Chadwell, Ivin Booty, PA-C  atorvastatin (LIPITOR) 20 MG tablet Take 20 mg by mouth daily.   Yes [provider]  levothyroxine (SYNTHROID, LEVOTHROID) 112 MCG tablet Take 1 tablet (112 mcg total) by mouth daily before breakfast. 07/05/12  Yes Rizwan, Ladell Heads, MD  metoprolol tartrate (LOPRESSOR) 25 MG tablet Take 25 mg by mouth daily. 11/28/20  Yes [provider]  nortriptyline (PAMELOR) 50 MG capsule Take 50 mg by mouth at bedtime. 01/08/21  Yes [provider]  omeprazole (PRILOSEC) 40 MG capsule Take 40 mg by mouth daily. 01/02/21  Yes [provider]  oxyCODONE-acetaminophen (  PERCOCET) 7.5-325 MG tablet Take 1 tablet by mouth every 4 (four) hours as needed for severe pain. 03/13/21  Yes Chadwell, Ivin Booty, PA-C  predniSONE (DELTASONE) 20 MG tablet Take 20 mg by mouth daily.    [provider]    Allergies    Penicillins  Review of Systems   Review of Systems  Musculoskeletal:  Positive for joint swelling and myalgias.  Skin:  Positive for wound.  All other systems reviewed and are negative.  Physical Exam Updated Vital Signs BP 119/80 (BP Location: Left Arm)   Pulse 93   Temp 98.4 F (36.9 C) (Oral)   Resp 18   Ht 5\' 3"  (1.6 m)   Wt 65.3 kg   SpO2 96%   BMI 25.50 kg/m    Physical Exam Vitals and nursing note reviewed.  Constitutional:      General: She is not in acute distress.    Appearance: Normal appearance.     Comments: Resting in the bed in NAD  HENT:     Head: Normocephalic and atraumatic.  Eyes:     Conjunctiva/sclera: Conjunctivae normal.     Pupils: Pupils are equal, round, and reactive to light.  Cardiovascular:     Rate and Rhythm: Normal rate and regular rhythm.     Pulses: Normal pulses.  Pulmonary:     Effort: Pulmonary effort is normal. No respiratory distress.     Breath sounds: Normal breath sounds. No wheezing.     Comments: Speaking in full sentences.  Clear lung sounds in all fields. Abdominal:     General: There is no distension.     Palpations: Abdomen is soft.     Tenderness: There is no abdominal tenderness.  Musculoskeletal:        General: Swelling and tenderness present.     Cervical back: Normal range of motion and neck supple.     Comments: Left leg status post knee replacement.  Wound is healing appropriately without erythema or drainage.  Left leg is more swollen on the right, mildly tender in the left thigh, no significant tenderness palpation of the left calf.  Pedal pulse 2+ bilaterally.  Skin:    General: Skin is warm and dry.     Capillary Refill: Capillary refill takes less than 2 seconds.  Neurological:     Mental Status: She is alert and oriented to person, place, and time.  Psychiatric:        Mood and Affect: Mood and affect normal.        Speech: Speech normal.        Behavior: Behavior normal.    ED Results / Procedures / Treatments   Labs (all labs ordered are listed, but only abnormal results are displayed) Labs Reviewed - No data to display  EKG None  Radiology No results found.  Procedures Procedures   Medications Ordered in ED Medications - No data to display  ED Course  I have reviewed the triage vital signs and the nursing notes.  Pertinent labs & imaging results that were  available during my care of the patient were reviewed by me and considered in my medical decision making (see chart for details).    MDM Rules/Calculators/A&P                           Patient presenting for evaluation of increasing leg pain and swelling in the setting of recent knee surgery.  On exam, patient appears neurovascularly  intact.  She does have some mild tenderness palpation of the left upper thigh, as well as some swelling of the left leg compared to the right.  Wound appears to be healing well without signs of infection.  Will obtain ultrasound to rule out DVT.  Pt signed out to Mariel Aloe, MD for f/u on DVT US.   Final Clinical Impression(s) / ED Diagnoses Final diagnoses:  None    Rx / DC Orders ED Discharge Orders     None        Alveria Apley, PA-C 03/15/21 1940    Long, Arlyss Repress, MD 03/20/21 [phone]

## 2021-03-15 NOTE — Discharge Instructions (Addendum)
Your ultrasound did not show evidence of blood clot in your leg.  Please continue your home pain medications and other treatments.  Please follow closely with your orthopedics and primary care teams.  Return to the emergency department any new or suddenly worsening symptoms such as chest pain or trouble breathing.

## 2022-07-29 ENCOUNTER — Telehealth: Payer: Self-pay

## 2022-07-29 NOTE — Telephone Encounter (Signed)
Dr Rodena Piety from [place] called to let it be known the patient was being discharged and [facility] that the patient would need a CT Abd/Pelvis with contrast to look for resolution of pneumotestinalis? The provider is not In this office but has been route to him

## 2023-06-28 IMAGING — US US EXTREM LOW VENOUS*L*
1 series · 14 of 24 positions shown · non-contrast
Comparison: None.

CLINICAL DATA: Left leg pain/swelling, status post knee surgery

EXAM:
LEFT LOWER EXTREMITY VENOUS DOPPLER ULTRASOUND
TECHNIQUE: Gray-scale sonography with compression, as well as color and duplex
ultrasound, were performed to evaluate the deep venous system(s)
from the level of the common femoral vein through the popliteal and
proximal calf veins.

[Series 1: us extrem low venous*left* · 14 of 40 slices shown]
[im 1/40]
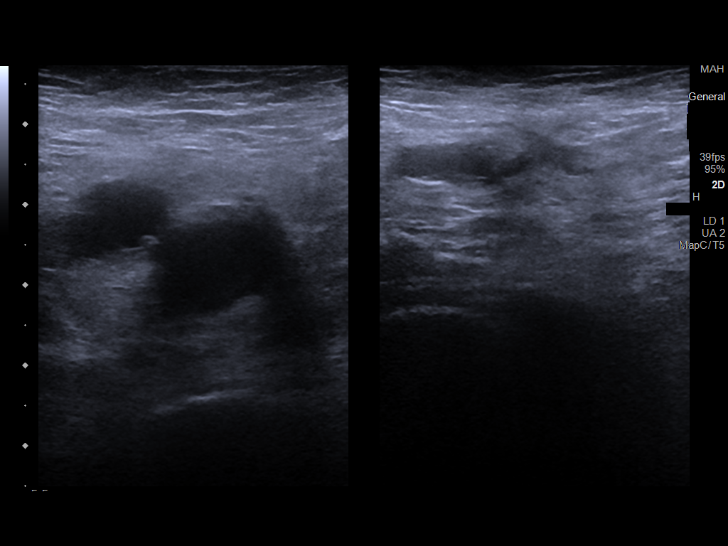
[im 4/40]
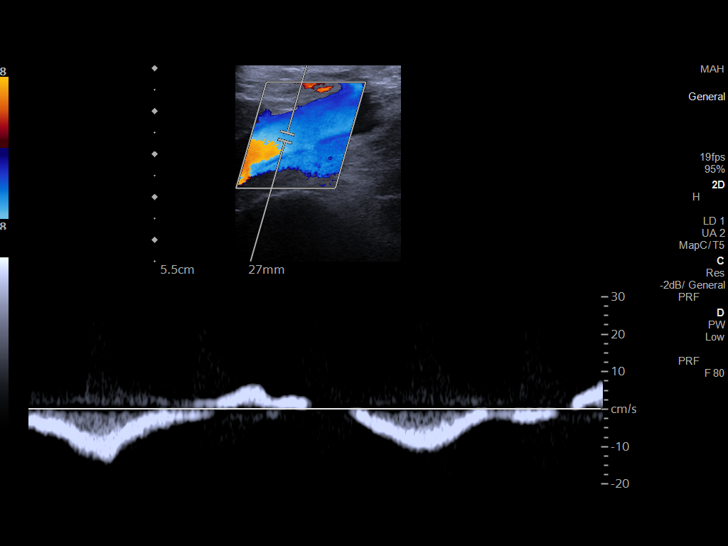
[im 7/40]
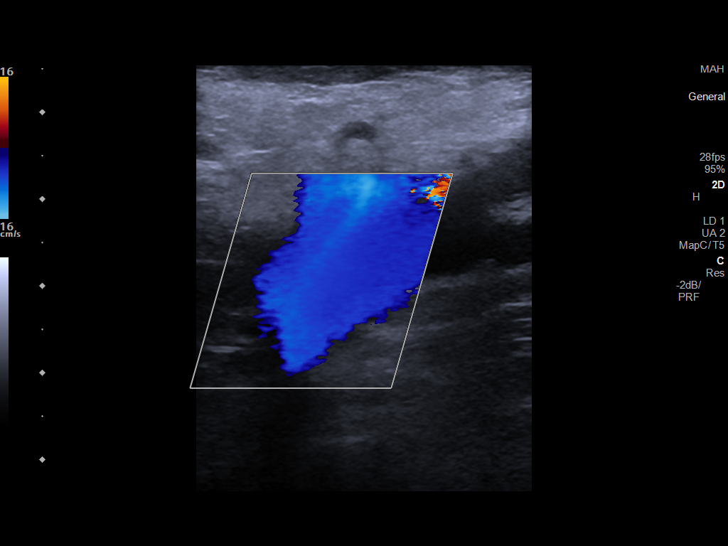
[im 11/40]
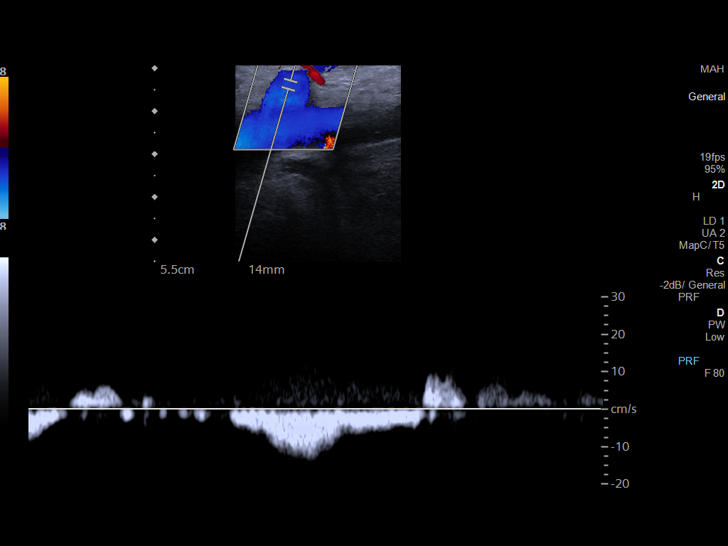
[im 12/40]
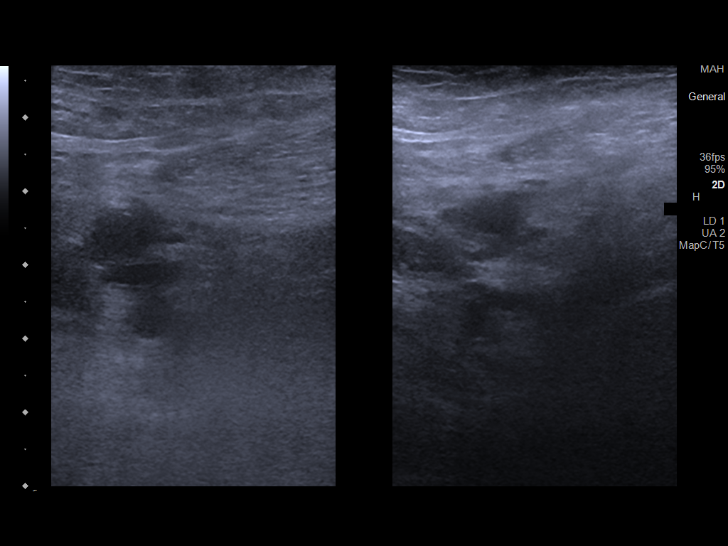
[im 16/40]
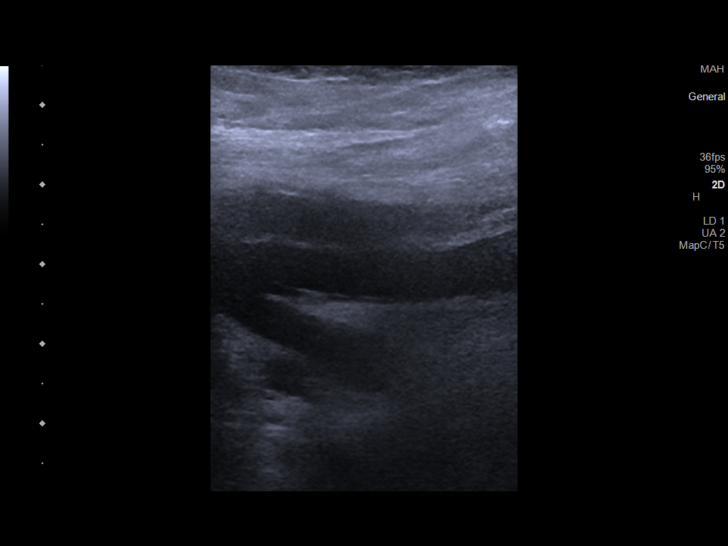
[im 19/40]
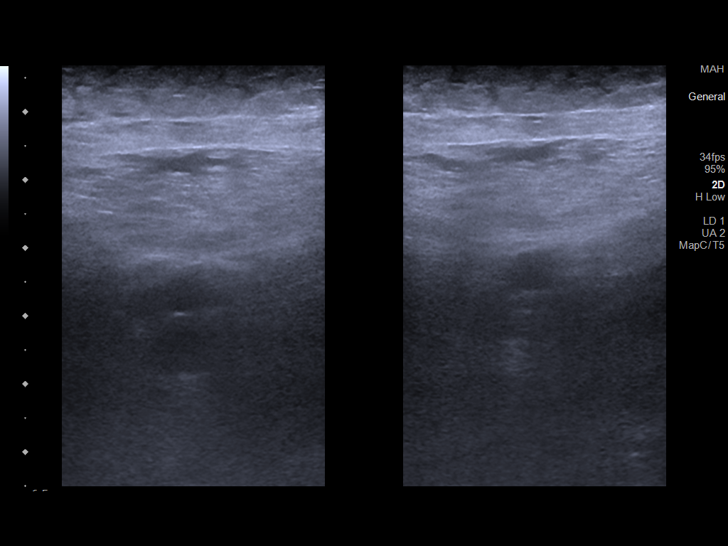
[im 21/40]
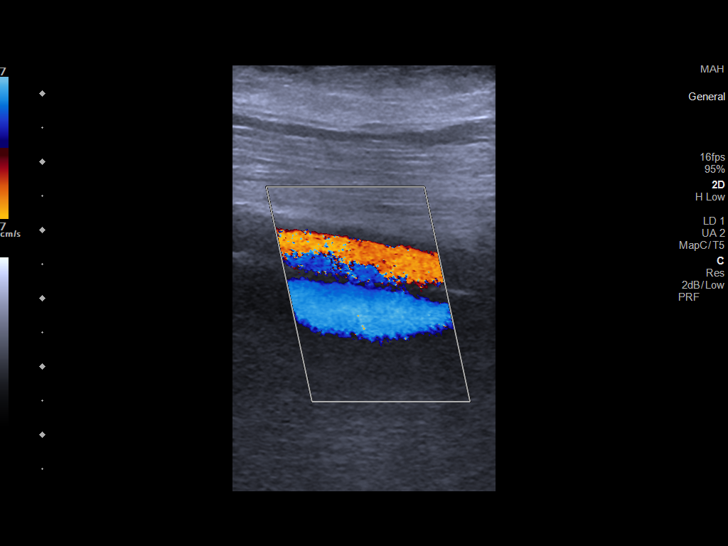
[im 24/40]
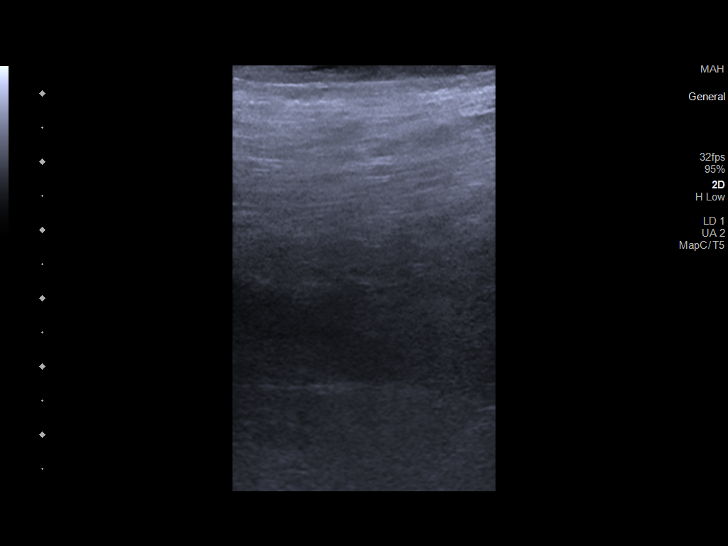
[im 28/40]
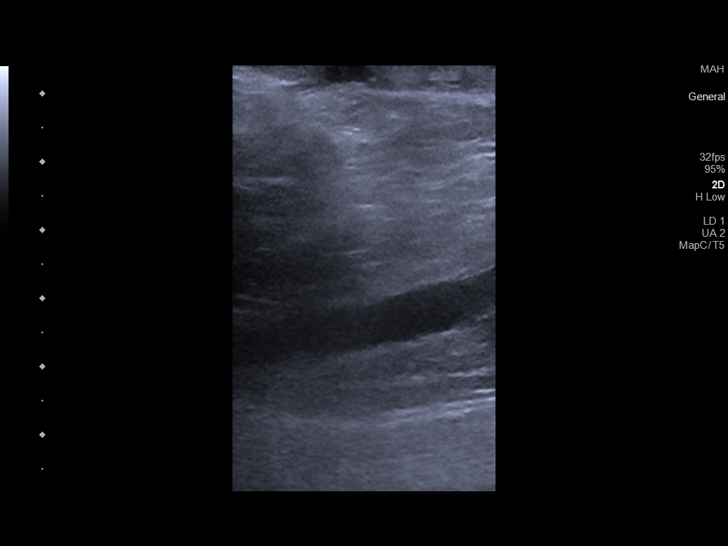
[im 31/40]
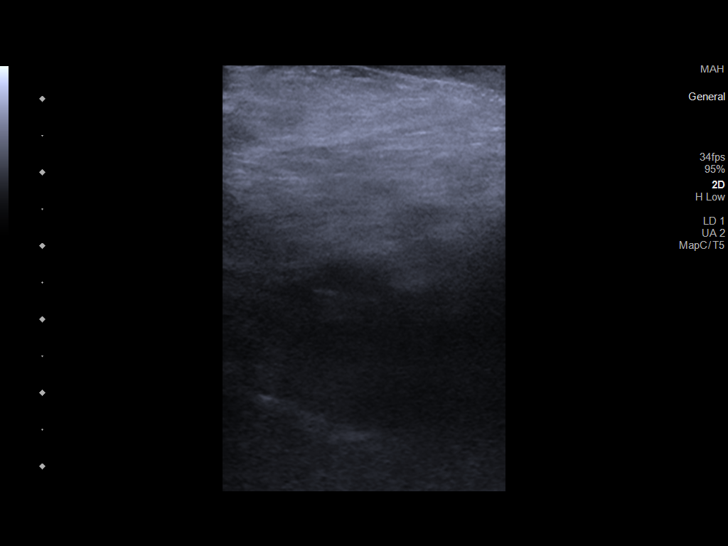
[im 33/40]
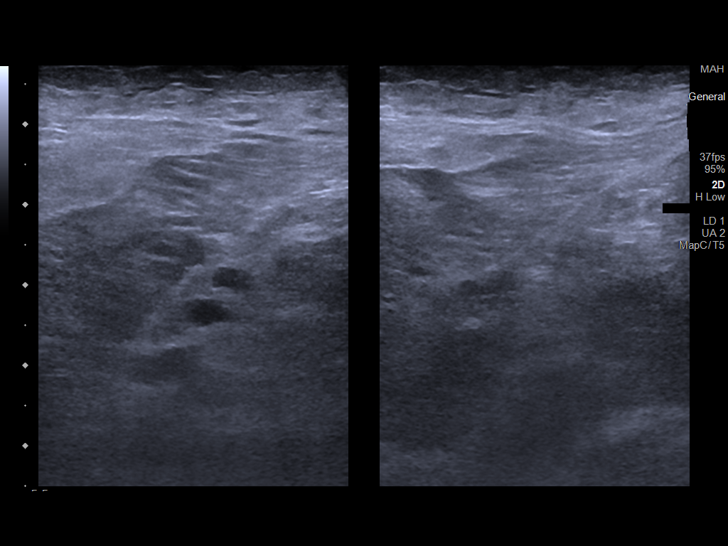
[im 36/40]
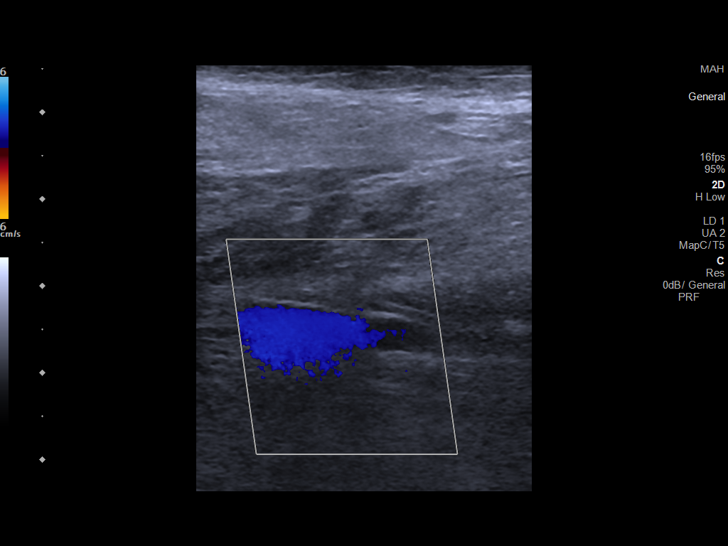
[im 40/40]
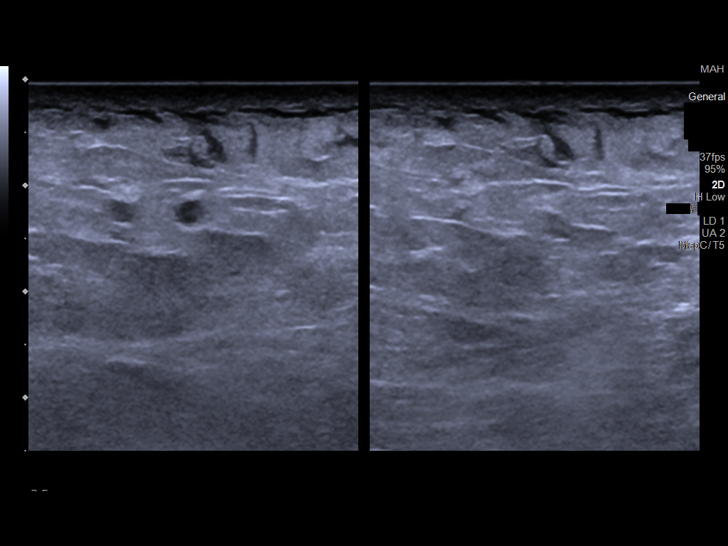

[14 of 24 positions shown; findings below may reference images not displayed]

FINDINGS: VENOUS

Normal compressibility of the common femoral, superficial femoral,
and popliteal veins, as well as the visualized calf veins.
Visualized portions of profunda femoral vein and great saphenous
vein unremarkable. No filling defects to suggest DVT on grayscale or
color Doppler imaging. Doppler waveforms show normal direction of
venous flow, normal respiratory plasticity and response to
augmentation.

Limited views of the contralateral common femoral vein are
unremarkable.

OTHER

None.

Limitations: none
IMPRESSION: Negative.

## 2023-09-25 ENCOUNTER — Encounter (HOSPITAL_BASED_OUTPATIENT_CLINIC_OR_DEPARTMENT_OTHER): Payer: Self-pay | Admitting: Emergency Medicine

## 2023-09-25 ENCOUNTER — Other Ambulatory Visit: Payer: Self-pay

## 2023-09-25 DIAGNOSIS — R197 Diarrhea, unspecified: Secondary | ICD-10-CM | POA: Diagnosis present

## 2023-09-25 DIAGNOSIS — R1084 Generalized abdominal pain: Secondary | ICD-10-CM | POA: Diagnosis not present

## 2023-09-25 DIAGNOSIS — R112 Nausea with vomiting, unspecified: Secondary | ICD-10-CM | POA: Insufficient documentation

## 2023-09-25 DIAGNOSIS — E039 Hypothyroidism, unspecified: Secondary | ICD-10-CM | POA: Diagnosis not present

## 2023-09-25 DIAGNOSIS — I10 Essential (primary) hypertension: Secondary | ICD-10-CM | POA: Diagnosis not present

## 2023-09-25 MED ORDER — ONDANSETRON 4 MG PO TBDP: 4.0000 mg | ORAL_TABLET | Freq: Once | ORAL | Status: DC | PRN

## 2023-09-25 NOTE — ED Notes (Signed)
 Labs collected today at [facility] values available for review in epic.

## 2023-09-25 NOTE — ED Triage Notes (Signed)
 Pt POV in wheelchair- per daughter pt has had diarrhea x2 days.  Hx of colostomy bag and reversal, reports poor PO intake, emesis x2 in last 24h. Denies urinary sx.   Recently d/c from hospital last Thursday for dehydration.

## 2023-09-26 ENCOUNTER — Emergency Department (HOSPITAL_BASED_OUTPATIENT_CLINIC_OR_DEPARTMENT_OTHER)

## 2023-09-26 ENCOUNTER — Emergency Department (HOSPITAL_BASED_OUTPATIENT_CLINIC_OR_DEPARTMENT_OTHER)
Admission: EM | Admit: 2023-09-26 | Discharge: 2023-09-26 | Disposition: A | Discharge status: Home / Self Care | Attending: Emergency Medicine | Admitting: Emergency Medicine

## 2023-09-26 DIAGNOSIS — K529 Noninfective gastroenteritis and colitis, unspecified: Secondary | ICD-10-CM

## 2023-09-26 LAB — COMPREHENSIVE METABOLIC PANEL
ALT: 12 U/L (ref 0–44)
AST: 23 U/L (ref 15–41)
Albumin: 2.8 g/dL — ABNORMAL LOW (ref 3.5–5.0)
Alkaline Phosphatase: 52 U/L (ref 38–126)
Anion gap: 8 (ref 5–15)
BUN: 11 mg/dL (ref 8–23)
CO2: 24 mmol/L (ref 22–32)
Calcium: 8.3 mg/dL — ABNORMAL LOW (ref 8.9–10.3)
Chloride: 106 mmol/L (ref 98–111)
Creatinine, Ser: 0.84 mg/dL (ref 0.44–1.00)
GFR, Estimated: >60 mL/min (ref >60)
Glucose, Bld: 100 mg/dL — ABNORMAL HIGH (ref 70–99)
Potassium: 3.5 mmol/L (ref 3.5–5.1)
Sodium: 138 mmol/L (ref 135–145)
Total Bilirubin: 0.7 mg/dL (ref 0.0–1.2)
Total Protein: 7.3 g/dL (ref 6.5–8.1)

## 2023-09-26 LAB — CBC
HCT: 31.5 % — ABNORMAL LOW (ref 36.0–46.0)
Hemoglobin: 10 g/dL — ABNORMAL LOW (ref 12.0–15.0)
MCH: 28.8 pg (ref 26.0–34.0)
MCHC: 31.7 g/dL (ref 30.0–36.0)
MCV: 90.8 fL (ref 80.0–100.0)
Platelets: 287 10*3/uL (ref 150–400)
RBC: 3.47 MIL/uL — ABNORMAL LOW (ref 3.87–5.11)
RDW: 17.2 % — ABNORMAL HIGH (ref 11.5–15.5)
WBC: 5.8 10*3/uL (ref 4.0–10.5)
nRBC: 0 % (ref 0.0–0.2)

## 2023-09-26 LAB — LIPASE, BLOOD: Lipase: 18 U/L (ref 11–51)

## 2023-09-26 MED ORDER — ONDANSETRON 8 MG PO TBDP: ORAL_TABLET | ORAL | 0 refills | Status: AC

## 2023-09-26 MED ORDER — MORPHINE SULFATE (PF) 4 MG/ML IV SOLN
4.0000 mg | Freq: Once | INTRAVENOUS | Status: AC
  Administered 2023-09-26: 4 mg via INTRAVENOUS
  Filled 2023-09-26: qty 1

## 2023-09-26 MED ORDER — IOHEXOL 300 MG/ML  SOLN
100.0000 mL | Freq: Once | INTRAMUSCULAR | Status: AC | PRN
Start: 2023-09-26 — End: 2023-09-26
  Administered 2023-09-26: 100 mL via INTRAVENOUS

## 2023-09-26 MED ORDER — SODIUM CHLORIDE 0.9 % IV BOLUS
1000.0000 mL | Freq: Once | INTRAVENOUS | Status: AC
  Administered 2023-09-26: 1000 mL via INTRAVENOUS

## 2023-09-26 MED ORDER — ONDANSETRON HCL 4 MG/2ML IJ SOLN
4.0000 mg | Freq: Once | INTRAMUSCULAR | Status: AC
  Administered 2023-09-26: 4 mg via INTRAVENOUS
  Filled 2023-09-26: qty 2

## 2023-09-26 NOTE — ED Provider Notes (Signed)
 [place] EMERGENCY DEPARTMENT AT [facility] Provider Note   CSN: [id number] Arrival date & time: 09/25/23  2115     History  Chief Complaint  Patient presents with   Diarrhea   Emesis    Alexa Chavez is a 78 y.o. female.  Patient is a 78 year old female with past medical history of prior ostomy with reversal, hypothyroidism, hyperlipidemia, hypertension.  Patient presenting today with complaints of abdominal pain, vomiting, and diarrhea.  This has been an ongoing problem for her for the past 2 years since her abdominal surgery.  Patient was just recently admitted at [facility] with similar complaints.  She was diagnosed with small bowel obstruction, and was discharged last week.  No fevers or chills.  No bloody stool or vomit.  The history is provided by the patient.       Home Medications Prior to Admission medications   Medication Sig Start Date End Date Taking? Authorizing Provider  atorvastatin (LIPITOR) 20 MG tablet Take 20 mg by mouth daily.    [provider]  levothyroxine (SYNTHROID, LEVOTHROID) 112 MCG tablet Take 1 tablet (112 mcg total) by mouth daily before breakfast. 07/05/12   Calvert Cantor, MD  metoprolol tartrate (LOPRESSOR) 25 MG tablet Take 25 mg by mouth daily. 11/28/20   [provider]  nortriptyline (PAMELOR) 50 MG capsule Take 50 mg by mouth at bedtime. 01/08/21   [provider]  omeprazole (PRILOSEC) 40 MG capsule Take 40 mg by mouth daily. 01/02/21   [provider]  oxyCODONE-acetaminophen (PERCOCET) 7.5-325 MG tablet Take 1 tablet by mouth every 4 (four) hours as needed for severe pain. 03/13/21   Chadwell, Ivin Booty, PA-C  predniSONE (DELTASONE) 20 MG tablet Take 20 mg by mouth daily.    [provider]      Allergies    Penicillins    Review of Systems   Review of Systems  All other systems reviewed and are negative.   Physical Exam Updated Vital Signs BP 134/75 (BP Location:  Right Arm)   Pulse (!) 101   Temp 97.8 F (36.6 C) (Oral)   Resp 14   Ht 5\' 3"  (1.6 m)   Wt 47.6 kg   SpO2 100%   BMI 18.60 kg/m  Physical Exam Vitals and nursing note reviewed.  Constitutional:      General: She is not in acute distress.    Appearance: She is well-developed. She is not diaphoretic.  HENT:     Head: Normocephalic and atraumatic.  Cardiovascular:     Rate and Rhythm: Normal rate and regular rhythm.     Heart sounds: No murmur heard.    No friction rub. No gallop.  Pulmonary:     Effort: Pulmonary effort is normal. No respiratory distress.     Breath sounds: Normal breath sounds. No wheezing.  Abdominal:     General: Bowel sounds are normal. There is no distension.     Palpations: Abdomen is soft.     Tenderness: There is abdominal tenderness. There is no right CVA tenderness, left CVA tenderness, guarding or rebound.     Comments: There is mild generalized tenderness.  No distention or peritoneal signs.  Musculoskeletal:        General: Normal range of motion.     Cervical back: Normal range of motion and neck supple.  Skin:    General: Skin is warm and dry.  Neurological:     General: No focal deficit present.  Mental Status: She is alert and oriented to person, place, and time.     ED Results / Procedures / Treatments   Labs (all labs ordered are listed, but only abnormal results are displayed) Labs Reviewed  LIPASE, BLOOD  COMPREHENSIVE METABOLIC PANEL  CBC  URINALYSIS, ROUTINE W REFLEX MICROSCOPIC    EKG None  Radiology No results found.  Procedures Procedures    Medications Ordered in ED Medications  ondansetron (ZOFRAN-ODT) disintegrating tablet 4 mg (has no administration in time range)  sodium chloride 0.9 % bolus 1,000 mL (has no administration in time range)  ondansetron (ZOFRAN) injection 4 mg (has no administration in time range)  morphine (PF) 4 MG/ML injection 4 mg (has no administration in time range)    ED Course/  Medical Decision Making/ A&P  Patient is a 78 year old female with history of prior abdominal surgeries presenting with abdominal pain, vomiting, and diarrhea.  She tells me that this has been ongoing for the past 2 years, but worse over the past 2 days.  Patient arrives with stable vital signs and is afebrile.  Physical examination reveals a benign abdomen.  Laboratory studies obtained including CBC, CMP, and lipase, all of which are unremarkable.  There is no leukocytosis, no elevation of liver or pancreatic enzymes, and no electrolyte derangement.  She is mildly anemic with a hemoglobin of 10.  CT scan of the abdomen and pelvis obtained showing signs of possible small bowel obstruction.  Patient was hydrated with normal saline and given morphine for pain and Zofran for nausea and seems to be feeling significantly improved.  These issues seem to be chronic for her.  Upon reviewing her CAT scan, small bowel obstruction does not fit the clinical picture as patient has been having diarrhea.  Since she is feeling better I feel as though she can safely be discharged.  I will prescribe Zofran and have her follow-up with her general surgeon.  Final Clinical Impression(s) / ED Diagnoses Final diagnoses:  None    Rx / DC Orders ED Discharge Orders     None         Geoffery Lyons, MD 09/26/23 [phone]

## 2023-09-26 NOTE — Discharge Instructions (Signed)
 Begin taking Zofran as prescribed as needed for nausea.  Clear liquids for the next 12 hours, then slowly advance diet as tolerated.  Return to the emergency department if you develop severe abdominal pain, worsening vomiting, bloody stools, high fevers, or for other new and concerning symptoms.
